# Patient Record
Sex: Male | Born: 1950 | Race: White | Hispanic: No | Marital: Married | Smoking: Never smoker
Health system: Southern US, Community
[De-identification: ages and names within clinical notes are randomized; demographics above are authoritative.]

## PROBLEM LIST (undated history)

## (undated) DIAGNOSIS — L309 Dermatitis, unspecified: Secondary | ICD-10-CM

## (undated) DIAGNOSIS — G709 Myoneural disorder, unspecified: Secondary | ICD-10-CM

## (undated) DIAGNOSIS — M1990 Unspecified osteoarthritis, unspecified site: Secondary | ICD-10-CM

## (undated) DIAGNOSIS — L509 Urticaria, unspecified: Secondary | ICD-10-CM

## (undated) DIAGNOSIS — G589 Mononeuropathy, unspecified: Secondary | ICD-10-CM

## (undated) DIAGNOSIS — H409 Unspecified glaucoma: Secondary | ICD-10-CM

## (undated) DIAGNOSIS — T7840XA Allergy, unspecified, initial encounter: Secondary | ICD-10-CM

## (undated) HISTORY — DX: Allergy, unspecified, initial encounter: T78.40XA

## (undated) HISTORY — PX: COLONOSCOPY: SHX174

## (undated) HISTORY — DX: Myoneural disorder, unspecified: G70.9

## (undated) HISTORY — PX: TONSILLECTOMY: SUR1361

## (undated) HISTORY — DX: Unspecified glaucoma: H40.9

## (undated) HISTORY — DX: Unspecified osteoarthritis, unspecified site: M19.90

---

## 1997-12-01 ENCOUNTER — Ambulatory Visit (HOSPITAL_COMMUNITY): Admission: RE | Admit: 1997-12-01 | Discharge: 1997-12-01 | Payer: Self-pay | Admitting: Internal Medicine

## 1999-09-20 ENCOUNTER — Encounter: Admission: RE | Admit: 1999-09-20 | Discharge: 1999-10-01 | Payer: Self-pay | Admitting: Neurology

## 2001-10-11 ENCOUNTER — Encounter: Payer: Self-pay | Admitting: Internal Medicine

## 2001-10-11 ENCOUNTER — Ambulatory Visit (HOSPITAL_COMMUNITY): Admission: RE | Admit: 2001-10-11 | Discharge: 2001-10-11 | Payer: Self-pay | Admitting: Internal Medicine

## 2011-06-13 ENCOUNTER — Other Ambulatory Visit: Payer: Self-pay | Admitting: Orthopedic Surgery

## 2011-06-13 DIAGNOSIS — M545 Low back pain, unspecified: Secondary | ICD-10-CM

## 2011-06-17 ENCOUNTER — Other Ambulatory Visit: Payer: Self-pay

## 2011-06-21 ENCOUNTER — Ambulatory Visit
Admission: RE | Admit: 2011-06-21 | Discharge: 2011-06-21 | Disposition: A | Discharge status: Home / Self Care | Payer: Managed Care, Other (non HMO) | Source: Ambulatory Visit | Attending: Orthopedic Surgery | Admitting: Orthopedic Surgery

## 2011-06-21 DIAGNOSIS — M545 Low back pain, unspecified: Secondary | ICD-10-CM

## 2011-06-21 MED ORDER — METHYLPREDNISOLONE ACETATE 40 MG/ML INJ SUSP (RADIOLOG
120.0000 mg | Freq: Once | INTRAMUSCULAR | Status: AC
  Administered 2011-06-21: 120 mg via EPIDURAL

## 2011-06-21 MED ORDER — IOHEXOL 180 MG/ML  SOLN
1.0000 mL | Freq: Once | INTRAMUSCULAR | Status: AC | PRN
  Administered 2011-06-21: 1 mL via EPIDURAL

## 2011-06-21 NOTE — Patient Instructions (Signed)

## 2011-06-30 ENCOUNTER — Other Ambulatory Visit: Payer: Self-pay | Admitting: Orthopedic Surgery

## 2011-06-30 DIAGNOSIS — M549 Dorsalgia, unspecified: Secondary | ICD-10-CM

## 2011-07-07 ENCOUNTER — Ambulatory Visit
Admission: RE | Admit: 2011-07-07 | Discharge: 2011-07-07 | Disposition: A | Discharge status: Home / Self Care | Payer: Managed Care, Other (non HMO) | Source: Ambulatory Visit | Attending: Orthopedic Surgery | Admitting: Orthopedic Surgery

## 2011-07-07 DIAGNOSIS — M549 Dorsalgia, unspecified: Secondary | ICD-10-CM

## 2011-07-07 MED ORDER — METHYLPREDNISOLONE ACETATE 40 MG/ML INJ SUSP (RADIOLOG
120.0000 mg | Freq: Once | INTRAMUSCULAR | Status: AC
  Administered 2011-07-07: 120 mg via EPIDURAL

## 2011-07-07 MED ORDER — IOHEXOL 180 MG/ML  SOLN
1.0000 mL | Freq: Once | INTRAMUSCULAR | Status: AC | PRN
  Administered 2011-07-07: 1 mL via EPIDURAL

## 2011-07-07 NOTE — Progress Notes (Signed)
Ambulates alone, gait steady.  Denies numbness or tingling.  Comfortable at present.   Dr Benard Rink here to see patient.  Reviewed discharge instructions & patient states that he understands.  1100  Discharged to home (wife to drive).

## 2012-06-10 DIAGNOSIS — L509 Urticaria, unspecified: Secondary | ICD-10-CM

## 2012-06-10 HISTORY — DX: Urticaria, unspecified: L50.9

## 2012-10-18 ENCOUNTER — Other Ambulatory Visit: Payer: Self-pay | Admitting: Internal Medicine

## 2012-10-18 DIAGNOSIS — E079 Disorder of thyroid, unspecified: Secondary | ICD-10-CM

## 2012-10-18 DIAGNOSIS — Z0000 Encounter for general adult medical examination without abnormal findings: Secondary | ICD-10-CM

## 2012-10-19 ENCOUNTER — Ambulatory Visit
Admission: RE | Admit: 2012-10-19 | Discharge: 2012-10-19 | Disposition: A | Discharge status: Home / Self Care | Payer: Managed Care, Other (non HMO) | Source: Ambulatory Visit | Attending: Internal Medicine | Admitting: Internal Medicine

## 2012-10-19 DIAGNOSIS — E079 Disorder of thyroid, unspecified: Secondary | ICD-10-CM

## 2012-10-22 ENCOUNTER — Other Ambulatory Visit: Payer: Self-pay | Admitting: Internal Medicine

## 2012-10-22 DIAGNOSIS — E041 Nontoxic single thyroid nodule: Secondary | ICD-10-CM

## 2012-10-30 ENCOUNTER — Other Ambulatory Visit (HOSPITAL_COMMUNITY)
Admission: RE | Admit: 2012-10-30 | Discharge: 2012-10-30 | Disposition: A | Discharge status: Home / Self Care | Payer: Managed Care, Other (non HMO) | Source: Ambulatory Visit | Attending: Interventional Radiology | Admitting: Interventional Radiology

## 2012-10-30 ENCOUNTER — Ambulatory Visit
Admission: RE | Admit: 2012-10-30 | Discharge: 2012-10-30 | Disposition: A | Discharge status: Home / Self Care | Payer: Managed Care, Other (non HMO) | Source: Ambulatory Visit | Attending: Internal Medicine | Admitting: Internal Medicine

## 2012-10-30 DIAGNOSIS — E049 Nontoxic goiter, unspecified: Secondary | ICD-10-CM | POA: Insufficient documentation

## 2012-10-30 DIAGNOSIS — E041 Nontoxic single thyroid nodule: Secondary | ICD-10-CM

## 2012-11-08 HISTORY — PX: THYROID CYST EXCISION: SHX2511

## 2012-11-29 ENCOUNTER — Other Ambulatory Visit: Payer: Self-pay | Admitting: Internal Medicine

## 2012-11-29 DIAGNOSIS — M545 Low back pain, unspecified: Secondary | ICD-10-CM

## 2012-12-02 ENCOUNTER — Ambulatory Visit
Admission: RE | Admit: 2012-12-02 | Discharge: 2012-12-02 | Disposition: A | Discharge status: Home / Self Care | Payer: 59 | Source: Ambulatory Visit | Attending: Internal Medicine | Admitting: Internal Medicine

## 2012-12-02 DIAGNOSIS — M545 Low back pain, unspecified: Secondary | ICD-10-CM

## 2012-12-10 ENCOUNTER — Encounter (HOSPITAL_COMMUNITY): Payer: Self-pay

## 2012-12-10 ENCOUNTER — Ambulatory Visit (HOSPITAL_COMMUNITY): Payer: Managed Care, Other (non HMO) | Admitting: Anesthesiology

## 2012-12-10 ENCOUNTER — Other Ambulatory Visit: Payer: Self-pay | Admitting: Neurosurgery

## 2012-12-10 ENCOUNTER — Ambulatory Visit (HOSPITAL_COMMUNITY): Payer: Managed Care, Other (non HMO)

## 2012-12-10 ENCOUNTER — Encounter (HOSPITAL_COMMUNITY)
Admission: RE | Disposition: A | Discharge status: Home / Self Care | Payer: Self-pay | Source: Ambulatory Visit | Attending: Neurosurgery

## 2012-12-10 ENCOUNTER — Encounter (HOSPITAL_COMMUNITY): Payer: Self-pay | Admitting: Anesthesiology

## 2012-12-10 ENCOUNTER — Observation Stay (HOSPITAL_COMMUNITY)
Admission: RE | Admit: 2012-12-10 | Discharge: 2012-12-11 | Disposition: A | Discharge status: Home / Self Care | Payer: Managed Care, Other (non HMO) | Source: Ambulatory Visit | Attending: Neurosurgery | Admitting: Neurosurgery

## 2012-12-10 DIAGNOSIS — Z79899 Other long term (current) drug therapy: Secondary | ICD-10-CM | POA: Insufficient documentation

## 2012-12-10 DIAGNOSIS — M48061 Spinal stenosis, lumbar region without neurogenic claudication: Secondary | ICD-10-CM | POA: Insufficient documentation

## 2012-12-10 DIAGNOSIS — M7130 Other bursal cyst, unspecified site: Principal | ICD-10-CM | POA: Insufficient documentation

## 2012-12-10 HISTORY — DX: Dermatitis, unspecified: L30.9

## 2012-12-10 HISTORY — PX: LUMBAR LAMINECTOMY/DECOMPRESSION MICRODISCECTOMY: SHX5026

## 2012-12-10 HISTORY — DX: Urticaria, unspecified: L50.9

## 2012-12-10 HISTORY — DX: Mononeuropathy, unspecified: G58.9

## 2012-12-10 LAB — CBC
HCT: 42.1 % (ref 39.0–52.0)
MCHC: 33.7 g/dL (ref 30.0–36.0)
MCV: 87.3 fL (ref 78.0–100.0)
RDW: 13.4 % (ref 11.5–15.5)
WBC: 6.3 10*3/uL (ref 4.0–10.5)

## 2012-12-10 SURGERY — LUMBAR LAMINECTOMY/DECOMPRESSION MICRODISCECTOMY 1 LEVEL
Anesthesia: General | Site: Spine Lumbar | Wound class: Clean

## 2012-12-10 MED ORDER — LIDOCAINE-EPINEPHRINE 1 %-1:100000 IJ SOLN
INTRAMUSCULAR | Status: DC | PRN
  Administered 2012-12-10: 10 mL

## 2012-12-10 MED ORDER — EPHEDRINE SULFATE 50 MG/ML IJ SOLN
INTRAMUSCULAR | Status: DC | PRN
  Administered 2012-12-10: 10 mg via INTRAVENOUS

## 2012-12-10 MED ORDER — BACITRACIN 50000 UNITS IM SOLR
INTRAMUSCULAR | Status: AC
  Filled 2012-12-10: qty 1

## 2012-12-10 MED ORDER — OXYCODONE-ACETAMINOPHEN 5-325 MG PO TABS: 1.0000 | ORAL_TABLET | ORAL | Status: DC | PRN

## 2012-12-10 MED ORDER — ONDANSETRON HCL 4 MG/2ML IJ SOLN: 4.0000 mg | INTRAMUSCULAR | Status: DC | PRN

## 2012-12-10 MED ORDER — ACETAMINOPHEN 325 MG PO TABS: 650.0000 mg | ORAL_TABLET | ORAL | Status: DC | PRN

## 2012-12-10 MED ORDER — THROMBIN 5000 UNITS EX SOLR
OROMUCOSAL | Status: DC | PRN
  Administered 2012-12-10: 20:00:00 via TOPICAL

## 2012-12-10 MED ORDER — OXYCODONE HCL 5 MG PO TABS: 5.0000 mg | ORAL_TABLET | Freq: Once | ORAL | Status: DC | PRN

## 2012-12-10 MED ORDER — SODIUM CHLORIDE 0.9 % IV SOLN: 250.0000 mL | INTRAVENOUS | Status: DC

## 2012-12-10 MED ORDER — ROCURONIUM BROMIDE 100 MG/10ML IV SOLN
INTRAVENOUS | Status: DC | PRN
  Administered 2012-12-10: 40 mg via INTRAVENOUS
  Administered 2012-12-10: 10 mg via INTRAVENOUS

## 2012-12-10 MED ORDER — ONDANSETRON HCL 4 MG/2ML IJ SOLN
INTRAMUSCULAR | Status: DC | PRN
  Administered 2012-12-10: 4 mg via INTRAVENOUS

## 2012-12-10 MED ORDER — PREDNISONE 10 MG PO TABS: 10.0000 mg | ORAL_TABLET | Freq: Every day | ORAL | Status: DC

## 2012-12-10 MED ORDER — HEMOSTATIC AGENTS (NO CHARGE) OPTIME
TOPICAL | Status: DC | PRN
  Administered 2012-12-10 (×2): 1 via TOPICAL

## 2012-12-10 MED ORDER — PROPOFOL 10 MG/ML IV BOLUS
INTRAVENOUS | Status: DC | PRN
  Administered 2012-12-10: 170 mg via INTRAVENOUS

## 2012-12-10 MED ORDER — MUPIROCIN 2 % EX OINT: TOPICAL_OINTMENT | Freq: Two times a day (BID) | CUTANEOUS | Status: DC

## 2012-12-10 MED ORDER — ADULT MULTIVITAMIN W/MINERALS CH
1.0000 | ORAL_TABLET | Freq: Every day | ORAL | Status: DC
  Filled 2012-12-10: qty 1

## 2012-12-10 MED ORDER — SODIUM CHLORIDE 0.9 % IJ SOLN
3.0000 mL | Freq: Two times a day (BID) | INTRAMUSCULAR | Status: DC
  Administered 2012-12-10: 3 mL via INTRAVENOUS

## 2012-12-10 MED ORDER — LIDOCAINE HCL (CARDIAC) 20 MG/ML IV SOLN
INTRAVENOUS | Status: DC | PRN
  Administered 2012-12-10: 60 mg via INTRAVENOUS

## 2012-12-10 MED ORDER — CYCLOBENZAPRINE HCL 10 MG PO TABS: 10.0000 mg | ORAL_TABLET | Freq: Three times a day (TID) | ORAL | Status: DC | PRN

## 2012-12-10 MED ORDER — CEFAZOLIN SODIUM 1-5 GM-% IV SOLN
1.0000 g | Freq: Three times a day (TID) | INTRAVENOUS | Status: DC
  Administered 2012-12-11: 1 g via INTRAVENOUS
  Filled 2012-12-10 (×2): qty 50

## 2012-12-10 MED ORDER — SODIUM CHLORIDE 0.9 % IR SOLN
Status: DC | PRN
  Administered 2012-12-10: 19:00:00

## 2012-12-10 MED ORDER — LACTATED RINGERS IV SOLN
INTRAVENOUS | Status: DC | PRN
  Administered 2012-12-10 (×4): via INTRAVENOUS

## 2012-12-10 MED ORDER — HYDROMORPHONE HCL PF 1 MG/ML IJ SOLN: 0.5000 mg | INTRAMUSCULAR | Status: DC | PRN

## 2012-12-10 MED ORDER — DOCUSATE SODIUM 100 MG PO CAPS
100.0000 mg | ORAL_CAPSULE | Freq: Two times a day (BID) | ORAL | Status: DC
  Administered 2012-12-10: 100 mg via ORAL

## 2012-12-10 MED ORDER — OXYCODONE HCL 5 MG/5ML PO SOLN: 5.0000 mg | Freq: Once | ORAL | Status: DC | PRN

## 2012-12-10 MED ORDER — ACETAMINOPHEN 650 MG RE SUPP: 650.0000 mg | RECTAL | Status: DC | PRN

## 2012-12-10 MED ORDER — PHENOL 1.4 % MT LIQD: 1.0000 | OROMUCOSAL | Status: DC | PRN

## 2012-12-10 MED ORDER — NEOSTIGMINE METHYLSULFATE 1 MG/ML IJ SOLN
INTRAMUSCULAR | Status: DC | PRN
  Administered 2012-12-10: 5 mg via INTRAVENOUS

## 2012-12-10 MED ORDER — HYDROMORPHONE HCL PF 1 MG/ML IJ SOLN: 0.2500 mg | INTRAMUSCULAR | Status: DC | PRN

## 2012-12-10 MED ORDER — GLYCOPYRROLATE 0.2 MG/ML IJ SOLN
INTRAMUSCULAR | Status: DC | PRN
  Administered 2012-12-10: 0.6 mg via INTRAVENOUS

## 2012-12-10 MED ORDER — PROMETHAZINE HCL 25 MG/ML IJ SOLN: 6.2500 mg | INTRAMUSCULAR | Status: DC | PRN

## 2012-12-10 MED ORDER — MIDAZOLAM HCL 5 MG/5ML IJ SOLN
INTRAMUSCULAR | Status: DC | PRN
  Administered 2012-12-10: 2 mg via INTRAVENOUS

## 2012-12-10 MED ORDER — DEXAMETHASONE SODIUM PHOSPHATE 4 MG/ML IJ SOLN
INTRAMUSCULAR | Status: DC | PRN
  Administered 2012-12-10: 10 mg via INTRAVENOUS

## 2012-12-10 MED ORDER — 0.9 % SODIUM CHLORIDE (POUR BTL) OPTIME
TOPICAL | Status: DC | PRN
  Administered 2012-12-10: 1000 mL

## 2012-12-10 MED ORDER — CEFAZOLIN SODIUM-DEXTROSE 2-3 GM-% IV SOLR
INTRAVENOUS | Status: AC
  Administered 2012-12-10: 2 g via INTRAVENOUS
  Filled 2012-12-10: qty 50

## 2012-12-10 MED ORDER — THROMBIN 5000 UNITS EX SOLR
CUTANEOUS | Status: DC | PRN
  Administered 2012-12-10 (×2): 5000 [IU] via TOPICAL

## 2012-12-10 MED ORDER — FENTANYL CITRATE 0.05 MG/ML IJ SOLN
INTRAMUSCULAR | Status: DC | PRN
  Administered 2012-12-10: 50 ug via INTRAVENOUS
  Administered 2012-12-10 (×2): 100 ug via INTRAVENOUS

## 2012-12-10 MED ORDER — SODIUM CHLORIDE 0.9 % IV SOLN
INTRAVENOUS | Status: AC
  Filled 2012-12-10: qty 500

## 2012-12-10 MED ORDER — DICLOFENAC SODIUM 75 MG PO TBEC
75.0000 mg | DELAYED_RELEASE_TABLET | Freq: Two times a day (BID) | ORAL | Status: DC | PRN
  Filled 2012-12-10: qty 1

## 2012-12-10 MED ORDER — ACETAMINOPHEN 10 MG/ML IV SOLN
1000.0000 mg | Freq: Four times a day (QID) | INTRAVENOUS | Status: DC
  Administered 2012-12-10 – 2012-12-11 (×2): 1000 mg via INTRAVENOUS
  Filled 2012-12-10 (×4): qty 100

## 2012-12-10 MED ORDER — MUPIROCIN 2 % EX OINT
TOPICAL_OINTMENT | CUTANEOUS | Status: AC
  Administered 2012-12-10: 1 via NASAL
  Filled 2012-12-10: qty 22

## 2012-12-10 MED ORDER — ALUM & MAG HYDROXIDE-SIMETH 200-200-20 MG/5ML PO SUSP: 30.0000 mL | Freq: Four times a day (QID) | ORAL | Status: DC | PRN

## 2012-12-10 MED ORDER — MENTHOL 3 MG MT LOZG: 1.0000 | LOZENGE | OROMUCOSAL | Status: DC | PRN

## 2012-12-10 MED ORDER — BUPIVACAINE HCL (PF) 0.25 % IJ SOLN
INTRAMUSCULAR | Status: DC | PRN
  Administered 2012-12-10: 10 mL

## 2012-12-10 MED ORDER — SODIUM CHLORIDE 0.9 % IJ SOLN: 3.0000 mL | INTRAMUSCULAR | Status: DC | PRN

## 2012-12-10 MED ORDER — ARTIFICIAL TEARS OP OINT
TOPICAL_OINTMENT | OPHTHALMIC | Status: DC | PRN
  Administered 2012-12-10: 1 via OPHTHALMIC

## 2012-12-10 SURGICAL SUPPLY — 63 items
ADH SKN CLS APL DERMABOND .7 (GAUZE/BANDAGES/DRESSINGS)
APL SKNCLS STERI-STRIP NONHPOA (GAUZE/BANDAGES/DRESSINGS) ×1
BAG DECANTER FOR FLEXI CONT (MISCELLANEOUS) ×2 IMPLANT
BENZOIN TINCTURE PRP APPL 2/3 (GAUZE/BANDAGES/DRESSINGS) ×2 IMPLANT
BLADE SURG 11 STRL SS (BLADE) ×2 IMPLANT
BLADE SURG ROTATE 9660 (MISCELLANEOUS) ×1 IMPLANT
BRUSH SCRUB EZ PLAIN DRY (MISCELLANEOUS) ×2 IMPLANT
BUR MATCHSTICK NEURO 3.0 LAGG (BURR) ×2 IMPLANT
BUR PRECISION FLUTE 6.0 (BURR) ×2 IMPLANT
CANISTER SUCTION 2500CC (MISCELLANEOUS) ×2 IMPLANT
CLOTH BEACON ORANGE TIMEOUT ST (SAFETY) ×2 IMPLANT
CONT SPEC 4OZ CLIKSEAL STRL BL (MISCELLANEOUS) ×3 IMPLANT
DECANTER SPIKE VIAL GLASS SM (MISCELLANEOUS) ×2 IMPLANT
DERMABOND ADVANCED (GAUZE/BANDAGES/DRESSINGS)
DERMABOND ADVANCED .7 DNX12 (GAUZE/BANDAGES/DRESSINGS) ×1 IMPLANT
DRAPE LAPAROTOMY 100X72X124 (DRAPES) ×2 IMPLANT
DRAPE MICROSCOPE ZEISS OPMI (DRAPES) ×1 IMPLANT
DRAPE POUCH INSTRU U-SHP 10X18 (DRAPES) ×2 IMPLANT
DRAPE PROXIMA HALF (DRAPES) IMPLANT
DRAPE SURG 17X23 STRL (DRAPES) ×2 IMPLANT
DRSG OPSITE 4X5.5 SM (GAUZE/BANDAGES/DRESSINGS) ×2 IMPLANT
DRSG OPSITE POSTOP 4X6 (GAUZE/BANDAGES/DRESSINGS) ×1 IMPLANT
DURAPREP 26ML APPLICATOR (WOUND CARE) ×2 IMPLANT
ELECT REM PT RETURN 9FT ADLT (ELECTROSURGICAL) ×2
ELECTRODE REM PT RTRN 9FT ADLT (ELECTROSURGICAL) ×1 IMPLANT
GAUZE SPONGE 4X4 16PLY XRAY LF (GAUZE/BANDAGES/DRESSINGS) IMPLANT
GLOVE BIO SURGEON STRL SZ 6.5 (GLOVE) ×1 IMPLANT
GLOVE BIO SURGEON STRL SZ7 (GLOVE) ×1 IMPLANT
GLOVE BIO SURGEON STRL SZ8 (GLOVE) ×2 IMPLANT
GLOVE BIOGEL M 8.0 STRL (GLOVE) ×1 IMPLANT
GLOVE BIOGEL PI IND STRL 7.5 (GLOVE) IMPLANT
GLOVE BIOGEL PI INDICATOR 7.5 (GLOVE) ×1
GLOVE ECLIPSE 7.5 STRL STRAW (GLOVE) ×2 IMPLANT
GLOVE EXAM NITRILE LRG STRL (GLOVE) IMPLANT
GLOVE EXAM NITRILE MD LF STRL (GLOVE) ×1 IMPLANT
GLOVE EXAM NITRILE XL STR (GLOVE) IMPLANT
GLOVE EXAM NITRILE XS STR PU (GLOVE) IMPLANT
GLOVE INDICATOR 8.5 STRL (GLOVE) ×2 IMPLANT
GOWN BRE IMP SLV AUR LG STRL (GOWN DISPOSABLE) ×3 IMPLANT
GOWN BRE IMP SLV AUR XL STRL (GOWN DISPOSABLE) ×4 IMPLANT
GOWN STRL REIN 2XL LVL4 (GOWN DISPOSABLE) IMPLANT
KIT BASIN OR (CUSTOM PROCEDURE TRAY) ×2 IMPLANT
KIT ROOM TURNOVER OR (KITS) ×2 IMPLANT
NDL SPNL 22GX3.5 QUINCKE BK (NEEDLE) ×1 IMPLANT
NEEDLE HYPO 22GX1.5 SAFETY (NEEDLE) ×2 IMPLANT
NEEDLE SPNL 22GX3.5 QUINCKE BK (NEEDLE) ×2 IMPLANT
NS IRRIG 1000ML POUR BTL (IV SOLUTION) ×2 IMPLANT
PACK LAMINECTOMY NEURO (CUSTOM PROCEDURE TRAY) ×2 IMPLANT
RUBBERBAND STERILE (MISCELLANEOUS) ×4 IMPLANT
SPECIMEN JAR SMALL (MISCELLANEOUS) ×1 IMPLANT
SPONGE GAUZE 4X4 12PLY (GAUZE/BANDAGES/DRESSINGS) ×2 IMPLANT
SPONGE SURGIFOAM ABS GEL SZ50 (HEMOSTASIS) ×3 IMPLANT
STRIP CLOSURE SKIN 1/2X4 (GAUZE/BANDAGES/DRESSINGS) ×2 IMPLANT
SUT SILK 3 0 SH 30 (SUTURE) ×1 IMPLANT
SUT VIC AB 0 CT1 18XCR BRD8 (SUTURE) ×1 IMPLANT
SUT VIC AB 0 CT1 8-18 (SUTURE) ×2
SUT VIC AB 2-0 CT1 18 (SUTURE) ×2 IMPLANT
SUT VICRYL 4-0 PS2 18IN ABS (SUTURE) ×2 IMPLANT
SYR 20ML ECCENTRIC (SYRINGE) ×2 IMPLANT
TAPE STRIPS DRAPE STRL (GAUZE/BANDAGES/DRESSINGS) ×1 IMPLANT
TOWEL OR 17X24 6PK STRL BLUE (TOWEL DISPOSABLE) ×2 IMPLANT
TOWEL OR 17X26 10 PK STRL BLUE (TOWEL DISPOSABLE) ×2 IMPLANT
WATER STERILE IRR 1000ML POUR (IV SOLUTION) ×2 IMPLANT

## 2012-12-10 NOTE — H&P (Addendum)
Glen Parker is an 62 y.o. male.   Chief Complaint: Left hip leg pain numbness and weakness HPI: Patient is a 62 year old woman who has had really on of back and leg pain has progressed  to weakness. This began in the beginning of May report first episode was awakening him Mother's Day felt severe pain is leg getting out of the shower. Initially this got better however he  aggravated over a week ago when he was leaning is concrete wall leaning against his lower back causing pressure and created numbness in his left leg. He also noted some numbness lack of sensation in his scrotum and penis he also noted progressive weakness.   Workup was initiated MRI was taken and he was referred to me. He currently complains of the pain rating probably the left hip down the outside and back of his left thigh and calf and is on his foot he denies any right leg symptoms denies any trouble does bowel or bladder denies any real back  pain. His MRI scan showed a very large synovial cyst that began off the left L4-5 facet joint and were crossing midline to ask about 90% of his canal. Causing severe thecal sac compression. Due to his progressive weakness of his left lower extremity was sized synovial cyst I have recommended decompressive laminectomy for resection of synovial cyst. I have extensively reviewed the risks and benefits of the operation as well as perioperative course expectations of outcome and alternatives of surgery and he understands and agrees to proceed forward.  Past Medical History  Diagnosis Date  . 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 Eczema     occasional  . Pinched nerve     hx of in neck. 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    Past Surgical History  Procedure Laterality Date  . Tonsillectomy      as child  . Thyroid cyst excision  11/2012    right    Family History  Problem Relation Age of Onset  . Hypertension Mother   . Heart disease Father   . Hypertension Sister    . Bone cancer Other    Social History:  reports that he has never smoked. He does not have any smokeless tobacco history on file. He reports that he drinks about 0.6 ounces of alcohol per week. His drug history is not on file.  Allergies: No Known Allergies  Medications Prior to Admission  Medication Sig Dispense Refill  . Ascorbic Acid (VITAMIN C PO) Take 0.5 tablets by mouth daily.      . brimonidine-timolol (COMBIGAN) 0.2-0.5 % ophthalmic solution Place 1 drop into both eyes every 12 (twelve) hours.      . diclofenac (VOLTAREN) 75 MG EC tablet Take 75 mg by mouth 2 (two) times daily as needed (for pain).      . magnesium oxide (MAG-OX) 400 MG tablet Take 400 mg by mouth daily.      . Multiple Vitamin (MULTIVITAMIN WITH MINERALS) TABS Take 1 tablet by mouth daily.      . Omega 3-6-9 Fatty Acids (OMEGA 3-6-9 COMPLEX) CAPS Take 1 capsule by mouth at bedtime.      Marland Kitchen OVER THE COUNTER MEDICATION Take 0.5 tablets by mouth daily. Antihistamine from [facility] - All Clear      . PRESCRIPTION MEDICATION Apply 1 application topically daily as needed (for eczema around eyes). 4 different creams - 1 sample from doctor's office - others not  from [facility] - alternates the 4 creams - only uses one type each day      . predniSONE (DELTASONE) 10 MG tablet Take 10 mg by mouth daily. 6 day dose pak completed on 12/03/12        Results for orders placed during the hospital encounter of 12/10/12 (from the past 48 hour(s))  SURGICAL PCR SCREEN     Status: None   Collection Time    12/10/12  3:25 PM      Result Value Range   MRSA, PCR NEGATIVE  NEGATIVE   Staphylococcus aureus NEGATIVE  NEGATIVE   Comment:            The Xpert SA Assay (FDA     approved for NASAL specimens     in patients over 64 years of age),     is one component of     a comprehensive surveillance     program.  Test performance has     been validated by [employer] for patients greater     than or equal to 11 year old.     It is not  intended     to diagnose infection nor to     guide or monitor treatment.  CBC     Status: None   Collection Time    12/10/12  3:26 PM      Result Value Range   WBC 6.3  4.0 - 10.5 K/uL   RBC 4.82  4.22 - 5.81 MIL/uL   Hemoglobin 14.2  13.0 - 17.0 g/dL   HCT 16.1  09.6 - 04.5 %   MCV 87.3  78.0 - 100.0 fL   MCH 29.5  26.0 - 34.0 pg   MCHC 33.7  30.0 - 36.0 g/dL   RDW 40.9  81.1 - 91.4 %   Platelets 193  150 - 400 K/uL   No results found.  Review of Systems  Constitutional: Negative.   HENT: Negative.   Eyes: Negative.   Respiratory: Negative.   Cardiovascular: Negative.   Gastrointestinal: Negative.   Genitourinary: Negative.   Musculoskeletal: Positive for myalgias, joint pain and falls.  Skin: Negative.   Neurological: Positive for tingling, tremors and sensory change.  Endo/Heme/Allergies: Negative.   Psychiatric/Behavioral: Negative.     Blood pressure 128/93, pulse 66, temperature 98.4 F (36.9 C), temperature source Oral, resp. rate 16, height 5' 10.5" (1.791 m), weight 76.006 kg (167 lb 9 oz), SpO2 97.00%. Physical Exam  Constitutional: He is oriented to person, place, and time. He appears well-developed and well-nourished.  HENT:  Head: Normocephalic.  Eyes: Pupils are equal, round, and reactive to light.  Neck: Normal range of motion.  Cardiovascular: Normal rate.   Respiratory: Effort normal.  GI: Soft.  Musculoskeletal: Normal range of motion.  Neurological: He is alert and oriented to person, place, and time. He displays atrophy. GCS eye subscore is 4. GCS verbal subscore is 5. GCS motor subscore is 6.  Reflex Scores:      Tricep reflexes are 2+ on the right side and 2+ on the left side.      Bicep reflexes are 2+ on the right side and 2+ on the left side.      Brachioradialis reflexes are 2+ on the right side and 2+ on the left side.      Patellar reflexes are 2+ on the right side and 2+ on the left side.      Achilles reflexes are 0  on the right side  and 0 on the left side. Strength in the right lower extremity is 5 out of 5 in his iliopsoas, quads commands-tibialis, EHL. Strength in the left lower extremity is 4+ out of 5 in his quads and hamstrings 4+ out of 5 dorsiflexor and EHL 5 out of 5 plantar flexion     Assessment/Plan 62 year old presents for L4-5 decompressive laminectomy for removal of a synovial cyst.  Fraidy Mccarrick P 12/10/2012, 5:38 PM

## 2012-12-10 NOTE — Anesthesia Procedure Notes (Signed)
Procedure Name: Intubation Date/Time: 12/10/2012 6:28 PM Performed by: Brien Mates DOBSON Pre-anesthesia Checklist: Patient identified, Emergency Drugs available, Suction available, Patient being monitored and Timeout performed Patient Re-evaluated:Patient Re-evaluated prior to inductionOxygen Delivery Method: Circle system utilized Preoxygenation: Pre-oxygenation with 100% oxygen Intubation Type: IV induction Ventilation: Mask ventilation without difficulty Laryngoscope Size: Miller and 2 Grade View: Grade I Tube type: Oral Tube size: 7.5 mm Number of attempts: 1 Airway Equipment and Method: Stylet Placement Confirmation: ETT inserted through vocal cords under direct vision,  positive ETCO2 and breath sounds checked- equal and bilateral Secured at: 23 cm Tube secured with: Tape Dental Injury: Teeth and Oropharynx as per pre-operative assessment

## 2012-12-10 NOTE — Anesthesia Preprocedure Evaluation (Addendum)
Anesthesia Evaluation    Reviewed: Allergy & Precautions, H&P , NPO status , Patient's Chart, lab work & pertinent test results  History of Anesthesia Complications Negative for: history of anesthetic complications  Airway Mallampati: I TM Distance: >3 FB Neck ROM: Full    Dental  (+) Teeth Intact and Dental Advisory Given   Pulmonary neg pulmonary ROS,  breath sounds clear to auscultation        Cardiovascular negative cardio ROS  Rhythm:Regular Rate:Normal     Neuro/Psych negative psych ROS   GI/Hepatic negative GI ROS, Neg liver ROS,   Endo/Other  negative endocrine ROS  Renal/GU negative Renal ROS     Musculoskeletal   Abdominal Normal abdominal exam  (+)   Peds  Hematology   Anesthesia Other Findings   Reproductive/Obstetrics                          Anesthesia Physical Anesthesia Plan  ASA: II  Anesthesia Plan: General   Post-op Pain Management:    Induction: Intravenous  Airway Management Planned: Oral ETT  Additional Equipment:   Intra-op Plan:   Post-operative Plan: Extubation in OR  Informed Consent:   Plan Discussed with: CRNA, Anesthesiologist and Surgeon  Anesthesia Plan Comments:         Anesthesia Quick Evaluation

## 2012-12-10 NOTE — Transfer of Care (Signed)
Immediate Anesthesia Transfer of Care Note  Patient: Glen Parker  Procedure(s) Performed: Procedure(s) with comments: LUMBAR LAMINECTOMY/DECOMPRESSION MICRODISCECTOMY 1 LEVEL (N/A) - Lumbar four-five  Patient Location: PACU  Anesthesia Type:General  Level of Consciousness: sedated  Airway & Oxygen Therapy: Patient Spontanous Breathing and Patient connected to face mask oxygen  Post-op Assessment: Report given to PACU RN and Post -op Vital signs reviewed and stable  Post vital signs: Reviewed and stable  Complications: No apparent anesthesia complications

## 2012-12-10 NOTE — Preoperative (Signed)
Beta Blockers   Reason not to administer Beta Blockers:Not Applicable 

## 2012-12-10 NOTE — Op Note (Signed)
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  EBL: Minimal  History of present illness: Patient is a very pleasant 62 year old gentleman who over the last week his head weakness in his left leg and the last 2 weeks have progressive worsening pain and now a numbness and weakness in the left leg workup revealed a very large synovial cyst causing severe thecal sac and cauda equina compression patient had weakness an exam and episodic numbness in his perineum so patient was recommended urgent resection of a large L4-5 synovial cyst I extensively reviewed the risks and benefits of the operation as well as perioperative course and expectations of outcome and alternatives to surgery he understood and agreed to proceed forward.  Operative procedure: Patient was induced under general anesthesia was positioned prone the Wilson frame his back was prepped and draped in routine sterile fashion. Preoperative x-ray localize the appropriate level so after infiltration 10 cc lidocaine with epi a midline incision was made and Bovie light car was used to gas subcutaneous tissues and subperiosteal dissections care on entire lamina of L4 the super aspect of the lamina of L5. Interoperative X. identify the appropriate level the spinous process of L4 smooth central decompression was begun the laminotomy was extended slightly inferiorly to include just the superior aspect of the L5 lamina and the entire L4 lamina was removed this is was immediately identified and the dorsal spinal canal native dura was identified right of midline right of  the cyst and inferiorly and then working from normal dura through the cyst the cyst wall was carefully dissected off of the dura under microscopic illumination cyst was then fenestrated and a large amount of old hematoma was aspirated the aspiration of the hematoma immediately decompress the thecal sac and then care was taken to resect the membrane off the dura and the entire medial membrane of the symphysis was resected off the dura. I extended down inferiorly to identify the L4-5 disc space and the L5 nerve and the L5 pedicle awl cyst was resected off the medial aspect of super aspect of the L5 pedicle extending from the disc space all the way up to the L4 pedicle and the L4 nerve was also identified there was extensive amount of cyst wall material encasing the L4 nerve this was dissected off of the L4 nerve root the nerve hook and resected. I extended the laminotomy and C2 just above the shoulder of the L4 nerve root and then cleaned out all the cyst wall at appreciate from the lateral aspect of the canal and the medial aspect of the facet complex from just inferior to the L4 nerve root all the way to the top of the L5 nerve root. At the end of the resection I saw no further fragments of cyst wall to sac was widely decompressed the L4 and L4 and 5 nerve roots were clearly visualized and decompressed the foramen easily excepting a hockey-stick and coronary dilator. Epidural veins are coagulated Dickinson a stasis was maintained Gelfoam was laid up the dura a medium neck drain was placed and the wounds closed in layers with after Vicryl and a running 4 subcuticular in the skin.  At the end of case all needle counts and sponge counts were correct.

## 2012-12-10 NOTE — Anesthesia Postprocedure Evaluation (Signed)
  Anesthesia Post-op Note  Patient: Glen Parker  Procedure(s) Performed: Procedure(s) with comments: LUMBAR LAMINECTOMY/DECOMPRESSION MICRODISCECTOMY 1 LEVEL (N/A) - Lumbar four-five  Patient Location: PACU  Anesthesia Type:General  Level of Consciousness: awake  Airway and Oxygen Therapy: Patient Spontanous Breathing  Post-op Pain: mild  Post-op Assessment: Post-op Vital signs reviewed  Post-op Vital Signs: Reviewed  Complications: No apparent anesthesia complications

## 2012-12-11 ENCOUNTER — Encounter (HOSPITAL_COMMUNITY): Payer: Self-pay | Admitting: Neurosurgery

## 2012-12-11 MED ORDER — CYCLOBENZAPRINE HCL 10 MG PO TABS: 10.0000 mg | ORAL_TABLET | Freq: Three times a day (TID) | ORAL | Status: DC | PRN

## 2012-12-11 MED ORDER — OXYCODONE-ACETAMINOPHEN 5-325 MG PO TABS: 1.0000 | ORAL_TABLET | ORAL | Status: DC | PRN

## 2012-12-11 NOTE — Progress Notes (Signed)
Patient ID: Glen Parker, male   DOB: Dec 09, 1950, 62 y.o.   MRN: [id number] Patient doing very well leg pain gone numbness improved strength improved minimal back pain wound clean and dry

## 2012-12-11 NOTE — Progress Notes (Signed)
Pt and wife given D/C instructions with Rx's, verbal understanding given. Pt D/C'd home via wheelchair @ 1050 per MD order. Zevin Nevares, RN  

## 2012-12-11 NOTE — Discharge Summary (Signed)
  Physician Discharge Summary  Patient ID: Glen Parker MRN: [id number] DOB/AGE: 03/16/1951 62 y.o.  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 *   Discharged Condition: good  Hospital Course: Patient hospital underwent laminectomy and resection of synovial cyst postop patient did very well went to the floor was angling and voiding spontaneously tolerating rigid diet was stable for discharge  Consults: Significant Diagnostic Studies: Treatments: Decompress limits at L4-5 for resection of synovial cyst Discharge Exam: Blood pressure 134/86, pulse 76, temperature 98.6 F (37 C), temperature source Oral, resp. rate 16, height 5' 10.5" (1.791 m), weight 76.006 kg (167 lb 9 oz), SpO2 96.00%. Thank out of 5 wound clean and dry home  Disposition: Home     Medication List    TAKE these medications       COMBIGAN 0.2-0.5 % ophthalmic solution  Generic drug:  brimonidine-timolol  Place 1 drop into both eyes every 12 (twelve) hours.     cyclobenzaprine 10 MG tablet  Commonly known as:  FLEXERIL  Take 1 tablet (10 mg total) by mouth 3 (three) times daily as needed for muscle spasms.     diclofenac 75 MG EC tablet  Commonly known as:  VOLTAREN  Take 75 mg by mouth 2 (two) times daily as needed (for pain).     magnesium oxide 400 MG tablet  Commonly known as:  MAG-OX  Take 400 mg by mouth daily.     multivitamin with minerals Tabs  Take 1 tablet by mouth daily.     OMEGA 3-6-9 COMPLEX Caps  Take 1 capsule by mouth at bedtime.     OVER THE COUNTER MEDICATION  Take 0.5 tablets by mouth daily. Antihistamine from [employer] - All Clear     oxyCODONE-acetaminophen 5-325 MG per tablet  Commonly known as:  PERCOCET/ROXICET  Take 1-2 tablets by mouth every 4 (four) hours as needed.     predniSONE 10 MG tablet  Commonly known  as:  DELTASONE  Take 10 mg by mouth daily. 6 day dose pak completed on 12/03/12     PRESCRIPTION MEDICATION  Apply 1 application topically daily as needed (for eczema around eyes). 4 different creams - 1 sample from doctor's office - others not from [employer] - alternates the 4 creams - only uses one type each day     VITAMIN C PO  Take 0.5 tablets by mouth daily.           Follow-up Information   Follow up with Keliyah Spillman P, MD.   Contact information:   [address] [place] [address] [phone]       Signed: Deborah Dondero P 12/11/2012, 10:00 AM

## 2015-10-26 DIAGNOSIS — H1033 Unspecified acute conjunctivitis, bilateral: Secondary | ICD-10-CM | POA: Diagnosis not present

## 2015-10-30 DIAGNOSIS — Z0001 Encounter for general adult medical examination with abnormal findings: Secondary | ICD-10-CM | POA: Diagnosis not present

## 2015-10-30 DIAGNOSIS — E079 Disorder of thyroid, unspecified: Secondary | ICD-10-CM | POA: Diagnosis not present

## 2015-10-30 DIAGNOSIS — R972 Elevated prostate specific antigen [PSA]: Secondary | ICD-10-CM | POA: Diagnosis not present

## 2015-10-30 DIAGNOSIS — E7800 Pure hypercholesterolemia, unspecified: Secondary | ICD-10-CM | POA: Diagnosis not present

## 2015-11-04 DIAGNOSIS — R972 Elevated prostate specific antigen [PSA]: Secondary | ICD-10-CM | POA: Diagnosis not present

## 2015-11-04 DIAGNOSIS — Z0000 Encounter for general adult medical examination without abnormal findings: Secondary | ICD-10-CM | POA: Diagnosis not present

## 2016-01-27 DIAGNOSIS — R69 Illness, unspecified: Secondary | ICD-10-CM | POA: Diagnosis not present

## 2016-02-08 DIAGNOSIS — R972 Elevated prostate specific antigen [PSA]: Secondary | ICD-10-CM | POA: Diagnosis not present

## 2016-02-08 DIAGNOSIS — N402 Nodular prostate without lower urinary tract symptoms: Secondary | ICD-10-CM | POA: Diagnosis not present

## 2016-04-01 DIAGNOSIS — H524 Presbyopia: Secondary | ICD-10-CM | POA: Diagnosis not present

## 2016-04-04 ENCOUNTER — Encounter: Payer: Self-pay | Admitting: Gastroenterology

## 2016-04-15 DIAGNOSIS — Z23 Encounter for immunization: Secondary | ICD-10-CM | POA: Diagnosis not present

## 2016-05-12 ENCOUNTER — Ambulatory Visit (AMBULATORY_SURGERY_CENTER): Payer: Self-pay | Admitting: *Deleted

## 2016-05-12 VITALS — Ht 70.5 in | Wt 179.0 lb

## 2016-05-12 DIAGNOSIS — Z1211 Encounter for screening for malignant neoplasm of colon: Secondary | ICD-10-CM

## 2016-05-12 MED ORDER — NA SULFATE-K SULFATE-MG SULF 17.5-3.13-1.6 GM/177ML PO SOLN: 1.0000 | Freq: Once | ORAL | 0 refills | Status: AC

## 2016-05-12 NOTE — Progress Notes (Signed)
No egg or soy allergy known to patient  No issues with past sedation with any surgeries  or procedures, no intubation problems  No diet pills per patient No home 02 use per patient  No blood thinners per patient  Pt denies issues with constipation  No A fib or A flutter   

## 2016-05-17 ENCOUNTER — Encounter: Payer: Self-pay | Admitting: Gastroenterology

## 2016-05-26 ENCOUNTER — Encounter: Payer: Self-pay | Admitting: Gastroenterology

## 2016-05-26 ENCOUNTER — Ambulatory Visit (AMBULATORY_SURGERY_CENTER): Payer: Medicare HMO | Admitting: Gastroenterology

## 2016-05-26 VITALS — BP 121/77 | HR 55 | Temp 96.8°F | Resp 16 | Ht 70.0 in | Wt 179.0 lb

## 2016-05-26 DIAGNOSIS — Z1212 Encounter for screening for malignant neoplasm of rectum: Secondary | ICD-10-CM

## 2016-05-26 DIAGNOSIS — Z1211 Encounter for screening for malignant neoplasm of colon: Secondary | ICD-10-CM

## 2016-05-26 DIAGNOSIS — D120 Benign neoplasm of cecum: Secondary | ICD-10-CM

## 2016-05-26 DIAGNOSIS — D175 Benign lipomatous neoplasm of intra-abdominal organs: Secondary | ICD-10-CM | POA: Diagnosis not present

## 2016-05-26 MED ORDER — SODIUM CHLORIDE 0.9 % IV SOLN: 500.0000 mL | INTRAVENOUS | Status: AC

## 2016-05-26 NOTE — Progress Notes (Signed)
A/ox3 pleased with MAC, report to Annette RN 

## 2016-05-26 NOTE — Progress Notes (Signed)
No problems noted in the recovery room. maw 

## 2016-05-26 NOTE — Patient Instructions (Signed)
YOU HAD AN ENDOSCOPIC PROCEDURE TODAY AT [facility]:   Refer to the procedure report that was given to you for any specific questions about what was found during the examination.  If the procedure report does not answer your questions, please call your gastroenterologist to clarify.  If you requested that your care partner not be given the details of your procedure findings, then the procedure report has been included in a sealed envelope for you to review at your convenience later.  YOU SHOULD EXPECT: Some feelings of bloating in the abdomen. Passage of more gas than usual.  Walking can help get rid of the air that was put into your GI tract during the procedure and reduce the bloating. If you had a lower endoscopy (such as a colonoscopy or flexible sigmoidoscopy) you may notice spotting of blood in your stool or on the toilet paper. If you underwent a bowel prep for your procedure, you may not have a normal bowel movement for a few days.  Please Note:  You might notice some irritation and congestion in your nose or some drainage.  This is from the oxygen used during your procedure.  There is no need for concern and it should clear up in a day or so.  SYMPTOMS TO REPORT IMMEDIATELY:   Following lower endoscopy (colonoscopy or flexible sigmoidoscopy):  Excessive amounts of blood in the stool  Significant tenderness or worsening of abdominal pains  Swelling of the abdomen that is new, acute  Fever of 100F or higher   Following upper endoscopy (EGD)  Vomiting of blood or coffee ground material  New chest pain or pain under the shoulder blades  Painful or persistently difficult swallowing  New shortness of breath  Fever of 100F or higher  Black, tarry-looking stools  For urgent or emergent issues, a gastroenterologist can be reached at any hour by calling [phone].   DIET:  We do recommend a small meal at first, but then you may proceed to your regular diet.  Drink  plenty of fluids but you should avoid alcoholic beverages for 24 hours.  ACTIVITY:  You should plan to take it easy for the rest of today and you should NOT DRIVE or use heavy machinery until tomorrow (because of the sedation medicines used during the test).    FOLLOW UP: Our staff will call the number listed on your records the next business day following your procedure to check on you and address any questions or concerns that you may have regarding the information given to you following your procedure. If we do not reach you, we will leave a message.  However, if you are feeling well and you are not experiencing any problems, there is no need to return our call.  We will assume that you have returned to your regular daily activities without incident.  If any biopsies were taken you will be contacted by phone or by letter within the next 1-3 weeks.  Please call us at [phone] if you have not heard about the biopsies in 3 weeks.    SIGNATURES/CONFIDENTIALITY: You and/or your care partner have signed paperwork which will be entered into your electronic medical record.  These signatures attest to the fact that that the information above on your After Visit Summary has been reviewed and is understood.  Full responsibility of the confidentiality of this discharge information lies with you and/or your care-partner.    Handouts were given to your care partner on polyps  and hemorrhoids. No aspirin, aspirin products,  ibuprofen, naproxen, advil, motrin, aleve, or other non-steroidal anti-inflammatory drugs for 14 days after polyp removal. You may resume your other current medications today. Await biopsy results. Please call if any questions or concerns.

## 2016-05-26 NOTE — Op Note (Signed)
[place] Patient Name: Glen Parker Procedure Date: 05/26/2016 11:00 AM MRN: [id number] Endoscopist: Ladene Artist , MD Age: 65 Referring MD:  Date of Birth: [birth year] Gender: Male Account #: [id number] Procedure:                Colonoscopy Indications:              Screening for colorectal malignant neoplasm, Last                            colonoscopy 10 years ago Medicines:                Monitored Anesthesia Care Procedure:                Pre-Anesthesia Assessment:                           - Prior to the procedure, a History and Physical                            was performed, and patient medications and                            allergies were reviewed. The patient's tolerance of                            previous anesthesia was also reviewed. The risks                            and benefits of the procedure and the sedation                            options and risks were discussed with the patient.                            All questions were answered, and informed consent                            was obtained. Prior Anticoagulants: The patient has                            taken no previous anticoagulant or antiplatelet                            agents. ASA Grade Assessment: II - A patient with                            mild systemic disease. After reviewing the risks                            and benefits, the patient was deemed in                            satisfactory condition to undergo the procedure.  After obtaining informed consent, the colonoscope                            was passed under direct vision. Throughout the                            procedure, the patient's blood pressure, pulse, and                            oxygen saturations were monitored continuously. The                            Model PCF-[id number]) scope was introduced                            through the anus and advanced to  the the cecum,                            identified by appendiceal orifice and ileocecal                            valve. The ileocecal valve, appendiceal orifice,                            and rectum were photographed. The quality of the                            bowel preparation was good. The colonoscopy was                            performed without difficulty. The patient tolerated                            the procedure well. Scope In: [id number] AM Scope Out: 11:24:18 AM Scope Withdrawal Time: 0 hours 14 minutes 39 seconds  Total Procedure Duration: 0 hours 18 minutes 4 seconds  Findings:                 The perianal and digital rectal examinations were                            normal.                           A 10 mm polyp was found in the ileocecal valve. The                            polyp was sessile. The polyp was removed with a hot                            snare. Resection and retrieval were complete.                           Internal hemorrhoids were found during  retroflexion. The hemorrhoids were medium-sized and                            Grade I (internal hemorrhoids that do not prolapse).                           The exam was otherwise without abnormality on                            direct and retroflexion views. Complications:            No immediate complications. Estimated blood loss:                            None. Estimated Blood Loss:     Estimated blood loss: none. Impression:               - One 10 mm polyp at the ileocecal valve, removed                            with a hot snare. Resected and retrieved.                           - Internal hemorrhoids.                           - The examination was otherwise normal on direct                            and retroflexion views. Recommendation:           - Repeat colonoscopy in 5 years for surveillance.                           - Patient has a contact number available  for                            emergencies. The signs and symptoms of potential                            delayed complications were discussed with the                            patient. Return to normal activities tomorrow.                            Written discharge instructions were provided to the                            patient.                           - Resume previous diet.                           - Continue present medications.                           -  Await pathology results.                           - No aspirin, ibuprofen, naproxen, or other                            non-steroidal anti-inflammatory drugs for 2 weeks                            after polyp removal. Ladene Artist, MD 05/26/2016 11:27:14 AM This report has been signed electronically.

## 2016-05-26 NOTE — Progress Notes (Signed)
Called to room to assist during endoscopic procedure.  Patient ID and intended procedure confirmed with present staff. Received instructions for my participation in the procedure from the performing physician.  

## 2016-05-27 ENCOUNTER — Telehealth: Payer: Self-pay | Admitting: *Deleted

## 2016-05-27 ENCOUNTER — Telehealth: Payer: Self-pay

## 2016-05-27 NOTE — Telephone Encounter (Signed)
  Follow up Call-  Call back number 05/26/2016  Post procedure Call Back phone  # [phone]  Permission to leave phone message Yes  Some recent data might be hidden     No answer. Left message to call with any questions or concerns. SMonday RN

## 2016-05-27 NOTE — Telephone Encounter (Signed)
  Follow up Call-  Call back number 05/26/2016  Post procedure Call Back phone  # [phone]  Permission to leave phone message Yes  Some recent data might be hidden    Patient was called for follow up after his procedure on 05/26/2016. No answer at the number given for follow up phone call. A message was left on the answering machine.

## 2016-06-06 ENCOUNTER — Encounter: Payer: Self-pay | Admitting: Gastroenterology

## 2016-06-08 DIAGNOSIS — Z6824 Body mass index (BMI) 24.0-24.9, adult: Secondary | ICD-10-CM | POA: Diagnosis not present

## 2016-06-08 DIAGNOSIS — H4010X0 Unspecified open-angle glaucoma, stage unspecified: Secondary | ICD-10-CM | POA: Diagnosis not present

## 2016-06-08 DIAGNOSIS — Z0000 Encounter for general adult medical examination without abnormal findings: Secondary | ICD-10-CM | POA: Diagnosis not present

## 2016-10-20 DIAGNOSIS — H401132 Primary open-angle glaucoma, bilateral, moderate stage: Secondary | ICD-10-CM | POA: Diagnosis not present

## 2016-10-27 DIAGNOSIS — D2272 Melanocytic nevi of left lower limb, including hip: Secondary | ICD-10-CM | POA: Diagnosis not present

## 2016-10-27 DIAGNOSIS — L570 Actinic keratosis: Secondary | ICD-10-CM | POA: Diagnosis not present

## 2016-10-27 DIAGNOSIS — D225 Melanocytic nevi of trunk: Secondary | ICD-10-CM | POA: Diagnosis not present

## 2016-10-27 DIAGNOSIS — D2261 Melanocytic nevi of right upper limb, including shoulder: Secondary | ICD-10-CM | POA: Diagnosis not present

## 2016-10-27 DIAGNOSIS — L821 Other seborrheic keratosis: Secondary | ICD-10-CM | POA: Diagnosis not present

## 2016-10-27 DIAGNOSIS — S30861A Insect bite (nonvenomous) of abdominal wall, initial encounter: Secondary | ICD-10-CM | POA: Diagnosis not present

## 2016-10-27 DIAGNOSIS — D485 Neoplasm of uncertain behavior of skin: Secondary | ICD-10-CM | POA: Diagnosis not present

## 2016-11-02 DIAGNOSIS — Z0000 Encounter for general adult medical examination without abnormal findings: Secondary | ICD-10-CM | POA: Diagnosis not present

## 2016-11-02 DIAGNOSIS — Z125 Encounter for screening for malignant neoplasm of prostate: Secondary | ICD-10-CM | POA: Diagnosis not present

## 2016-11-07 DIAGNOSIS — Z0000 Encounter for general adult medical examination without abnormal findings: Secondary | ICD-10-CM | POA: Diagnosis not present

## 2016-11-07 DIAGNOSIS — Z23 Encounter for immunization: Secondary | ICD-10-CM | POA: Diagnosis not present

## 2016-11-07 DIAGNOSIS — E7800 Pure hypercholesterolemia, unspecified: Secondary | ICD-10-CM | POA: Diagnosis not present

## 2016-11-07 DIAGNOSIS — R972 Elevated prostate specific antigen [PSA]: Secondary | ICD-10-CM | POA: Diagnosis not present

## 2017-01-25 DIAGNOSIS — R69 Illness, unspecified: Secondary | ICD-10-CM | POA: Diagnosis not present

## 2017-01-25 DIAGNOSIS — R972 Elevated prostate specific antigen [PSA]: Secondary | ICD-10-CM | POA: Diagnosis not present

## 2017-01-30 DIAGNOSIS — R69 Illness, unspecified: Secondary | ICD-10-CM | POA: Diagnosis not present

## 2017-02-01 DIAGNOSIS — N402 Nodular prostate without lower urinary tract symptoms: Secondary | ICD-10-CM | POA: Diagnosis not present

## 2017-02-01 DIAGNOSIS — R972 Elevated prostate specific antigen [PSA]: Secondary | ICD-10-CM | POA: Diagnosis not present

## 2017-03-08 DIAGNOSIS — M5137 Other intervertebral disc degeneration, lumbosacral region: Secondary | ICD-10-CM | POA: Diagnosis not present

## 2017-03-08 DIAGNOSIS — M9904 Segmental and somatic dysfunction of sacral region: Secondary | ICD-10-CM | POA: Diagnosis not present

## 2017-03-08 DIAGNOSIS — M47816 Spondylosis without myelopathy or radiculopathy, lumbar region: Secondary | ICD-10-CM | POA: Diagnosis not present

## 2017-03-08 DIAGNOSIS — M9903 Segmental and somatic dysfunction of lumbar region: Secondary | ICD-10-CM | POA: Diagnosis not present

## 2017-03-09 DIAGNOSIS — M9903 Segmental and somatic dysfunction of lumbar region: Secondary | ICD-10-CM | POA: Diagnosis not present

## 2017-03-09 DIAGNOSIS — M5137 Other intervertebral disc degeneration, lumbosacral region: Secondary | ICD-10-CM | POA: Diagnosis not present

## 2017-03-09 DIAGNOSIS — M9904 Segmental and somatic dysfunction of sacral region: Secondary | ICD-10-CM | POA: Diagnosis not present

## 2017-03-09 DIAGNOSIS — M47816 Spondylosis without myelopathy or radiculopathy, lumbar region: Secondary | ICD-10-CM | POA: Diagnosis not present

## 2017-03-14 DIAGNOSIS — M5137 Other intervertebral disc degeneration, lumbosacral region: Secondary | ICD-10-CM | POA: Diagnosis not present

## 2017-03-14 DIAGNOSIS — M9903 Segmental and somatic dysfunction of lumbar region: Secondary | ICD-10-CM | POA: Diagnosis not present

## 2017-03-14 DIAGNOSIS — M47816 Spondylosis without myelopathy or radiculopathy, lumbar region: Secondary | ICD-10-CM | POA: Diagnosis not present

## 2017-03-14 DIAGNOSIS — M9904 Segmental and somatic dysfunction of sacral region: Secondary | ICD-10-CM | POA: Diagnosis not present

## 2017-03-15 DIAGNOSIS — M5137 Other intervertebral disc degeneration, lumbosacral region: Secondary | ICD-10-CM | POA: Diagnosis not present

## 2017-03-15 DIAGNOSIS — M47816 Spondylosis without myelopathy or radiculopathy, lumbar region: Secondary | ICD-10-CM | POA: Diagnosis not present

## 2017-03-15 DIAGNOSIS — M9903 Segmental and somatic dysfunction of lumbar region: Secondary | ICD-10-CM | POA: Diagnosis not present

## 2017-03-15 DIAGNOSIS — M9904 Segmental and somatic dysfunction of sacral region: Secondary | ICD-10-CM | POA: Diagnosis not present

## 2017-03-16 DIAGNOSIS — M5137 Other intervertebral disc degeneration, lumbosacral region: Secondary | ICD-10-CM | POA: Diagnosis not present

## 2017-03-16 DIAGNOSIS — M47816 Spondylosis without myelopathy or radiculopathy, lumbar region: Secondary | ICD-10-CM | POA: Diagnosis not present

## 2017-03-16 DIAGNOSIS — M9904 Segmental and somatic dysfunction of sacral region: Secondary | ICD-10-CM | POA: Diagnosis not present

## 2017-03-16 DIAGNOSIS — M9903 Segmental and somatic dysfunction of lumbar region: Secondary | ICD-10-CM | POA: Diagnosis not present

## 2017-03-20 DIAGNOSIS — M5137 Other intervertebral disc degeneration, lumbosacral region: Secondary | ICD-10-CM | POA: Diagnosis not present

## 2017-03-20 DIAGNOSIS — M9904 Segmental and somatic dysfunction of sacral region: Secondary | ICD-10-CM | POA: Diagnosis not present

## 2017-03-20 DIAGNOSIS — M47816 Spondylosis without myelopathy or radiculopathy, lumbar region: Secondary | ICD-10-CM | POA: Diagnosis not present

## 2017-03-20 DIAGNOSIS — M9903 Segmental and somatic dysfunction of lumbar region: Secondary | ICD-10-CM | POA: Diagnosis not present

## 2017-03-22 DIAGNOSIS — M9904 Segmental and somatic dysfunction of sacral region: Secondary | ICD-10-CM | POA: Diagnosis not present

## 2017-03-22 DIAGNOSIS — M9903 Segmental and somatic dysfunction of lumbar region: Secondary | ICD-10-CM | POA: Diagnosis not present

## 2017-03-22 DIAGNOSIS — M47816 Spondylosis without myelopathy or radiculopathy, lumbar region: Secondary | ICD-10-CM | POA: Diagnosis not present

## 2017-03-22 DIAGNOSIS — M5137 Other intervertebral disc degeneration, lumbosacral region: Secondary | ICD-10-CM | POA: Diagnosis not present

## 2017-03-23 DIAGNOSIS — M47816 Spondylosis without myelopathy or radiculopathy, lumbar region: Secondary | ICD-10-CM | POA: Diagnosis not present

## 2017-03-23 DIAGNOSIS — M9903 Segmental and somatic dysfunction of lumbar region: Secondary | ICD-10-CM | POA: Diagnosis not present

## 2017-03-23 DIAGNOSIS — M9904 Segmental and somatic dysfunction of sacral region: Secondary | ICD-10-CM | POA: Diagnosis not present

## 2017-03-23 DIAGNOSIS — M5137 Other intervertebral disc degeneration, lumbosacral region: Secondary | ICD-10-CM | POA: Diagnosis not present

## 2017-03-27 DIAGNOSIS — M47816 Spondylosis without myelopathy or radiculopathy, lumbar region: Secondary | ICD-10-CM | POA: Diagnosis not present

## 2017-03-27 DIAGNOSIS — M9904 Segmental and somatic dysfunction of sacral region: Secondary | ICD-10-CM | POA: Diagnosis not present

## 2017-03-27 DIAGNOSIS — M9903 Segmental and somatic dysfunction of lumbar region: Secondary | ICD-10-CM | POA: Diagnosis not present

## 2017-03-27 DIAGNOSIS — M5137 Other intervertebral disc degeneration, lumbosacral region: Secondary | ICD-10-CM | POA: Diagnosis not present

## 2017-03-29 DIAGNOSIS — M47816 Spondylosis without myelopathy or radiculopathy, lumbar region: Secondary | ICD-10-CM | POA: Diagnosis not present

## 2017-03-29 DIAGNOSIS — M9904 Segmental and somatic dysfunction of sacral region: Secondary | ICD-10-CM | POA: Diagnosis not present

## 2017-03-29 DIAGNOSIS — M9903 Segmental and somatic dysfunction of lumbar region: Secondary | ICD-10-CM | POA: Diagnosis not present

## 2017-03-29 DIAGNOSIS — M5137 Other intervertebral disc degeneration, lumbosacral region: Secondary | ICD-10-CM | POA: Diagnosis not present

## 2017-03-30 DIAGNOSIS — M9903 Segmental and somatic dysfunction of lumbar region: Secondary | ICD-10-CM | POA: Diagnosis not present

## 2017-03-30 DIAGNOSIS — M47816 Spondylosis without myelopathy or radiculopathy, lumbar region: Secondary | ICD-10-CM | POA: Diagnosis not present

## 2017-03-30 DIAGNOSIS — M5137 Other intervertebral disc degeneration, lumbosacral region: Secondary | ICD-10-CM | POA: Diagnosis not present

## 2017-03-30 DIAGNOSIS — M9904 Segmental and somatic dysfunction of sacral region: Secondary | ICD-10-CM | POA: Diagnosis not present

## 2017-04-03 DIAGNOSIS — M47816 Spondylosis without myelopathy or radiculopathy, lumbar region: Secondary | ICD-10-CM | POA: Diagnosis not present

## 2017-04-03 DIAGNOSIS — M9904 Segmental and somatic dysfunction of sacral region: Secondary | ICD-10-CM | POA: Diagnosis not present

## 2017-04-03 DIAGNOSIS — M5137 Other intervertebral disc degeneration, lumbosacral region: Secondary | ICD-10-CM | POA: Diagnosis not present

## 2017-04-03 DIAGNOSIS — M9903 Segmental and somatic dysfunction of lumbar region: Secondary | ICD-10-CM | POA: Diagnosis not present

## 2017-04-05 DIAGNOSIS — M5137 Other intervertebral disc degeneration, lumbosacral region: Secondary | ICD-10-CM | POA: Diagnosis not present

## 2017-04-05 DIAGNOSIS — M47816 Spondylosis without myelopathy or radiculopathy, lumbar region: Secondary | ICD-10-CM | POA: Diagnosis not present

## 2017-04-05 DIAGNOSIS — M9904 Segmental and somatic dysfunction of sacral region: Secondary | ICD-10-CM | POA: Diagnosis not present

## 2017-04-05 DIAGNOSIS — M9903 Segmental and somatic dysfunction of lumbar region: Secondary | ICD-10-CM | POA: Diagnosis not present

## 2017-04-10 DIAGNOSIS — M5137 Other intervertebral disc degeneration, lumbosacral region: Secondary | ICD-10-CM | POA: Diagnosis not present

## 2017-04-10 DIAGNOSIS — M9903 Segmental and somatic dysfunction of lumbar region: Secondary | ICD-10-CM | POA: Diagnosis not present

## 2017-04-10 DIAGNOSIS — M47816 Spondylosis without myelopathy or radiculopathy, lumbar region: Secondary | ICD-10-CM | POA: Diagnosis not present

## 2017-04-10 DIAGNOSIS — M9904 Segmental and somatic dysfunction of sacral region: Secondary | ICD-10-CM | POA: Diagnosis not present

## 2017-04-13 DIAGNOSIS — M47816 Spondylosis without myelopathy or radiculopathy, lumbar region: Secondary | ICD-10-CM | POA: Diagnosis not present

## 2017-04-13 DIAGNOSIS — M9904 Segmental and somatic dysfunction of sacral region: Secondary | ICD-10-CM | POA: Diagnosis not present

## 2017-04-13 DIAGNOSIS — M5137 Other intervertebral disc degeneration, lumbosacral region: Secondary | ICD-10-CM | POA: Diagnosis not present

## 2017-04-13 DIAGNOSIS — M9903 Segmental and somatic dysfunction of lumbar region: Secondary | ICD-10-CM | POA: Diagnosis not present

## 2017-04-17 DIAGNOSIS — M47816 Spondylosis without myelopathy or radiculopathy, lumbar region: Secondary | ICD-10-CM | POA: Diagnosis not present

## 2017-04-17 DIAGNOSIS — M9903 Segmental and somatic dysfunction of lumbar region: Secondary | ICD-10-CM | POA: Diagnosis not present

## 2017-04-17 DIAGNOSIS — M5137 Other intervertebral disc degeneration, lumbosacral region: Secondary | ICD-10-CM | POA: Diagnosis not present

## 2017-04-17 DIAGNOSIS — M9904 Segmental and somatic dysfunction of sacral region: Secondary | ICD-10-CM | POA: Diagnosis not present

## 2017-04-20 DIAGNOSIS — M9904 Segmental and somatic dysfunction of sacral region: Secondary | ICD-10-CM | POA: Diagnosis not present

## 2017-04-20 DIAGNOSIS — M47816 Spondylosis without myelopathy or radiculopathy, lumbar region: Secondary | ICD-10-CM | POA: Diagnosis not present

## 2017-04-20 DIAGNOSIS — M5137 Other intervertebral disc degeneration, lumbosacral region: Secondary | ICD-10-CM | POA: Diagnosis not present

## 2017-04-20 DIAGNOSIS — M9903 Segmental and somatic dysfunction of lumbar region: Secondary | ICD-10-CM | POA: Diagnosis not present

## 2017-04-24 DIAGNOSIS — M9904 Segmental and somatic dysfunction of sacral region: Secondary | ICD-10-CM | POA: Diagnosis not present

## 2017-04-24 DIAGNOSIS — M5137 Other intervertebral disc degeneration, lumbosacral region: Secondary | ICD-10-CM | POA: Diagnosis not present

## 2017-04-24 DIAGNOSIS — M9903 Segmental and somatic dysfunction of lumbar region: Secondary | ICD-10-CM | POA: Diagnosis not present

## 2017-04-24 DIAGNOSIS — M47816 Spondylosis without myelopathy or radiculopathy, lumbar region: Secondary | ICD-10-CM | POA: Diagnosis not present

## 2017-04-26 DIAGNOSIS — H524 Presbyopia: Secondary | ICD-10-CM | POA: Diagnosis not present

## 2017-04-27 DIAGNOSIS — M47816 Spondylosis without myelopathy or radiculopathy, lumbar region: Secondary | ICD-10-CM | POA: Diagnosis not present

## 2017-04-27 DIAGNOSIS — M5137 Other intervertebral disc degeneration, lumbosacral region: Secondary | ICD-10-CM | POA: Diagnosis not present

## 2017-04-27 DIAGNOSIS — M9904 Segmental and somatic dysfunction of sacral region: Secondary | ICD-10-CM | POA: Diagnosis not present

## 2017-04-27 DIAGNOSIS — M9903 Segmental and somatic dysfunction of lumbar region: Secondary | ICD-10-CM | POA: Diagnosis not present

## 2017-05-01 DIAGNOSIS — M5137 Other intervertebral disc degeneration, lumbosacral region: Secondary | ICD-10-CM | POA: Diagnosis not present

## 2017-05-01 DIAGNOSIS — M9903 Segmental and somatic dysfunction of lumbar region: Secondary | ICD-10-CM | POA: Diagnosis not present

## 2017-05-01 DIAGNOSIS — M47816 Spondylosis without myelopathy or radiculopathy, lumbar region: Secondary | ICD-10-CM | POA: Diagnosis not present

## 2017-05-01 DIAGNOSIS — M9904 Segmental and somatic dysfunction of sacral region: Secondary | ICD-10-CM | POA: Diagnosis not present

## 2017-05-04 DIAGNOSIS — M5137 Other intervertebral disc degeneration, lumbosacral region: Secondary | ICD-10-CM | POA: Diagnosis not present

## 2017-05-04 DIAGNOSIS — M9904 Segmental and somatic dysfunction of sacral region: Secondary | ICD-10-CM | POA: Diagnosis not present

## 2017-05-04 DIAGNOSIS — M47816 Spondylosis without myelopathy or radiculopathy, lumbar region: Secondary | ICD-10-CM | POA: Diagnosis not present

## 2017-05-04 DIAGNOSIS — M9903 Segmental and somatic dysfunction of lumbar region: Secondary | ICD-10-CM | POA: Diagnosis not present

## 2017-05-08 DIAGNOSIS — M9903 Segmental and somatic dysfunction of lumbar region: Secondary | ICD-10-CM | POA: Diagnosis not present

## 2017-05-08 DIAGNOSIS — M9904 Segmental and somatic dysfunction of sacral region: Secondary | ICD-10-CM | POA: Diagnosis not present

## 2017-05-08 DIAGNOSIS — M47816 Spondylosis without myelopathy or radiculopathy, lumbar region: Secondary | ICD-10-CM | POA: Diagnosis not present

## 2017-05-08 DIAGNOSIS — M5137 Other intervertebral disc degeneration, lumbosacral region: Secondary | ICD-10-CM | POA: Diagnosis not present

## 2017-05-10 DIAGNOSIS — M259 Joint disorder, unspecified: Secondary | ICD-10-CM | POA: Diagnosis not present

## 2017-05-10 DIAGNOSIS — Z23 Encounter for immunization: Secondary | ICD-10-CM | POA: Diagnosis not present

## 2017-05-10 DIAGNOSIS — Z0000 Encounter for general adult medical examination without abnormal findings: Secondary | ICD-10-CM | POA: Diagnosis not present

## 2017-05-15 DIAGNOSIS — M9903 Segmental and somatic dysfunction of lumbar region: Secondary | ICD-10-CM | POA: Diagnosis not present

## 2017-05-15 DIAGNOSIS — M9904 Segmental and somatic dysfunction of sacral region: Secondary | ICD-10-CM | POA: Diagnosis not present

## 2017-05-15 DIAGNOSIS — M47816 Spondylosis without myelopathy or radiculopathy, lumbar region: Secondary | ICD-10-CM | POA: Diagnosis not present

## 2017-05-15 DIAGNOSIS — M5137 Other intervertebral disc degeneration, lumbosacral region: Secondary | ICD-10-CM | POA: Diagnosis not present

## 2017-05-22 DIAGNOSIS — M9904 Segmental and somatic dysfunction of sacral region: Secondary | ICD-10-CM | POA: Diagnosis not present

## 2017-05-22 DIAGNOSIS — M9903 Segmental and somatic dysfunction of lumbar region: Secondary | ICD-10-CM | POA: Diagnosis not present

## 2017-05-22 DIAGNOSIS — M5137 Other intervertebral disc degeneration, lumbosacral region: Secondary | ICD-10-CM | POA: Diagnosis not present

## 2017-05-22 DIAGNOSIS — M47816 Spondylosis without myelopathy or radiculopathy, lumbar region: Secondary | ICD-10-CM | POA: Diagnosis not present

## 2017-05-30 DIAGNOSIS — M5432 Sciatica, left side: Secondary | ICD-10-CM | POA: Diagnosis not present

## 2017-05-30 DIAGNOSIS — M5417 Radiculopathy, lumbosacral region: Secondary | ICD-10-CM | POA: Diagnosis not present

## 2017-05-30 DIAGNOSIS — M79605 Pain in left leg: Secondary | ICD-10-CM | POA: Diagnosis not present

## 2017-06-01 DIAGNOSIS — M79605 Pain in left leg: Secondary | ICD-10-CM | POA: Diagnosis not present

## 2017-06-01 DIAGNOSIS — M5417 Radiculopathy, lumbosacral region: Secondary | ICD-10-CM | POA: Diagnosis not present

## 2017-06-16 DIAGNOSIS — M5432 Sciatica, left side: Secondary | ICD-10-CM | POA: Diagnosis not present

## 2017-10-19 DIAGNOSIS — H409 Unspecified glaucoma: Secondary | ICD-10-CM | POA: Diagnosis not present

## 2017-10-19 DIAGNOSIS — N529 Male erectile dysfunction, unspecified: Secondary | ICD-10-CM | POA: Diagnosis not present

## 2017-10-19 DIAGNOSIS — Z8249 Family history of ischemic heart disease and other diseases of the circulatory system: Secondary | ICD-10-CM | POA: Diagnosis not present

## 2017-10-30 DIAGNOSIS — H401132 Primary open-angle glaucoma, bilateral, moderate stage: Secondary | ICD-10-CM | POA: Diagnosis not present

## 2017-11-02 DIAGNOSIS — D225 Melanocytic nevi of trunk: Secondary | ICD-10-CM | POA: Diagnosis not present

## 2017-11-02 DIAGNOSIS — L820 Inflamed seborrheic keratosis: Secondary | ICD-10-CM | POA: Diagnosis not present

## 2017-11-02 DIAGNOSIS — D2272 Melanocytic nevi of left lower limb, including hip: Secondary | ICD-10-CM | POA: Diagnosis not present

## 2017-11-02 DIAGNOSIS — L821 Other seborrheic keratosis: Secondary | ICD-10-CM | POA: Diagnosis not present

## 2017-11-02 DIAGNOSIS — L918 Other hypertrophic disorders of the skin: Secondary | ICD-10-CM | POA: Diagnosis not present

## 2017-11-02 DIAGNOSIS — I788 Other diseases of capillaries: Secondary | ICD-10-CM | POA: Diagnosis not present

## 2017-11-02 DIAGNOSIS — D1801 Hemangioma of skin and subcutaneous tissue: Secondary | ICD-10-CM | POA: Diagnosis not present

## 2017-11-02 DIAGNOSIS — D2261 Melanocytic nevi of right upper limb, including shoulder: Secondary | ICD-10-CM | POA: Diagnosis not present

## 2017-11-06 DIAGNOSIS — Z125 Encounter for screening for malignant neoplasm of prostate: Secondary | ICD-10-CM | POA: Diagnosis not present

## 2017-11-06 DIAGNOSIS — E7800 Pure hypercholesterolemia, unspecified: Secondary | ICD-10-CM | POA: Diagnosis not present

## 2017-11-06 DIAGNOSIS — Z0000 Encounter for general adult medical examination without abnormal findings: Secondary | ICD-10-CM | POA: Diagnosis not present

## 2017-11-13 DIAGNOSIS — Z0000 Encounter for general adult medical examination without abnormal findings: Secondary | ICD-10-CM | POA: Diagnosis not present

## 2017-11-27 DIAGNOSIS — H401132 Primary open-angle glaucoma, bilateral, moderate stage: Secondary | ICD-10-CM | POA: Diagnosis not present

## 2017-12-29 DIAGNOSIS — H2513 Age-related nuclear cataract, bilateral: Secondary | ICD-10-CM | POA: Diagnosis not present

## 2017-12-29 DIAGNOSIS — H401132 Primary open-angle glaucoma, bilateral, moderate stage: Secondary | ICD-10-CM | POA: Diagnosis not present

## 2017-12-29 DIAGNOSIS — H01006 Unspecified blepharitis left eye, unspecified eyelid: Secondary | ICD-10-CM | POA: Diagnosis not present

## 2017-12-29 DIAGNOSIS — H18413 Arcus senilis, bilateral: Secondary | ICD-10-CM | POA: Diagnosis not present

## 2018-01-24 DIAGNOSIS — R69 Illness, unspecified: Secondary | ICD-10-CM | POA: Diagnosis not present

## 2018-01-29 DIAGNOSIS — R972 Elevated prostate specific antigen [PSA]: Secondary | ICD-10-CM | POA: Diagnosis not present

## 2018-02-05 DIAGNOSIS — N402 Nodular prostate without lower urinary tract symptoms: Secondary | ICD-10-CM | POA: Diagnosis not present

## 2018-02-05 DIAGNOSIS — R972 Elevated prostate specific antigen [PSA]: Secondary | ICD-10-CM | POA: Diagnosis not present

## 2018-03-05 DIAGNOSIS — M25561 Pain in right knee: Secondary | ICD-10-CM | POA: Diagnosis not present

## 2018-03-06 DIAGNOSIS — M222X1 Patellofemoral disorders, right knee: Secondary | ICD-10-CM | POA: Diagnosis not present

## 2018-03-30 DIAGNOSIS — Z23 Encounter for immunization: Secondary | ICD-10-CM | POA: Diagnosis not present

## 2018-04-27 DIAGNOSIS — H5213 Myopia, bilateral: Secondary | ICD-10-CM | POA: Diagnosis not present

## 2018-11-07 DIAGNOSIS — L720 Epidermal cyst: Secondary | ICD-10-CM | POA: Diagnosis not present

## 2018-11-07 DIAGNOSIS — L570 Actinic keratosis: Secondary | ICD-10-CM | POA: Diagnosis not present

## 2018-11-07 DIAGNOSIS — S30860A Insect bite (nonvenomous) of lower back and pelvis, initial encounter: Secondary | ICD-10-CM | POA: Diagnosis not present

## 2018-11-07 DIAGNOSIS — D2272 Melanocytic nevi of left lower limb, including hip: Secondary | ICD-10-CM | POA: Diagnosis not present

## 2018-11-07 DIAGNOSIS — D225 Melanocytic nevi of trunk: Secondary | ICD-10-CM | POA: Diagnosis not present

## 2018-11-07 DIAGNOSIS — L821 Other seborrheic keratosis: Secondary | ICD-10-CM | POA: Diagnosis not present

## 2018-11-12 DIAGNOSIS — E7800 Pure hypercholesterolemia, unspecified: Secondary | ICD-10-CM | POA: Diagnosis not present

## 2018-11-12 DIAGNOSIS — Z125 Encounter for screening for malignant neoplasm of prostate: Secondary | ICD-10-CM | POA: Diagnosis not present

## 2018-11-12 DIAGNOSIS — Z0000 Encounter for general adult medical examination without abnormal findings: Secondary | ICD-10-CM | POA: Diagnosis not present

## 2018-11-19 DIAGNOSIS — E079 Disorder of thyroid, unspecified: Secondary | ICD-10-CM | POA: Diagnosis not present

## 2018-11-19 DIAGNOSIS — Z0000 Encounter for general adult medical examination without abnormal findings: Secondary | ICD-10-CM | POA: Diagnosis not present

## 2018-11-19 DIAGNOSIS — M5432 Sciatica, left side: Secondary | ICD-10-CM | POA: Diagnosis not present

## 2018-11-19 DIAGNOSIS — R972 Elevated prostate specific antigen [PSA]: Secondary | ICD-10-CM | POA: Diagnosis not present

## 2018-11-19 DIAGNOSIS — E781 Pure hyperglyceridemia: Secondary | ICD-10-CM | POA: Diagnosis not present

## 2018-12-31 DIAGNOSIS — H401132 Primary open-angle glaucoma, bilateral, moderate stage: Secondary | ICD-10-CM | POA: Diagnosis not present

## 2019-02-04 DIAGNOSIS — R972 Elevated prostate specific antigen [PSA]: Secondary | ICD-10-CM | POA: Diagnosis not present

## 2019-02-07 DIAGNOSIS — N402 Nodular prostate without lower urinary tract symptoms: Secondary | ICD-10-CM | POA: Diagnosis not present

## 2019-02-07 DIAGNOSIS — R972 Elevated prostate specific antigen [PSA]: Secondary | ICD-10-CM | POA: Diagnosis not present

## 2019-02-07 DIAGNOSIS — R3912 Poor urinary stream: Secondary | ICD-10-CM | POA: Diagnosis not present

## 2019-02-19 DIAGNOSIS — R69 Illness, unspecified: Secondary | ICD-10-CM | POA: Diagnosis not present

## 2019-04-12 DIAGNOSIS — R69 Illness, unspecified: Secondary | ICD-10-CM | POA: Diagnosis not present

## 2019-07-02 DIAGNOSIS — H524 Presbyopia: Secondary | ICD-10-CM | POA: Diagnosis not present

## 2019-08-10 ENCOUNTER — Ambulatory Visit: Payer: Medicare HMO

## 2019-08-15 ENCOUNTER — Ambulatory Visit: Payer: Medicare HMO

## 2019-08-15 ENCOUNTER — Ambulatory Visit: Payer: Medicare HMO | Attending: Internal Medicine

## 2019-08-15 DIAGNOSIS — Z23 Encounter for immunization: Secondary | ICD-10-CM | POA: Insufficient documentation

## 2019-08-15 NOTE — Progress Notes (Signed)
   Covid-19 Vaccination Clinic  Name:  Glen Parker    MRN: [id number] DOB: 02-Aug-1950  08/15/2019  Glen Parker was observed post Covid-19 immunization for 15 minutes without incidence. He was provided with Vaccine Information Sheet and instruction to access the V-Safe system.   Glen Parker was instructed to call 911 with any severe reactions post vaccine: Marland Kitchen Difficulty breathing  . Swelling of your face and throat  . A fast heartbeat  . A bad rash all over your body  . Dizziness and weakness    Immunizations Administered    Name Date Dose VIS Date Route   Pfizer COVID-19 Vaccine 08/15/2019  8:50 AM 0.3 mL 06/21/2019 Intramuscular   Manufacturer: [place]   Lot: [id number]   [place]: [id number]

## 2019-09-09 ENCOUNTER — Ambulatory Visit: Payer: Medicare HMO | Attending: Internal Medicine

## 2019-09-09 DIAGNOSIS — Z23 Encounter for immunization: Secondary | ICD-10-CM | POA: Insufficient documentation

## 2019-09-09 NOTE — Progress Notes (Signed)
   Covid-19 Vaccination Clinic  Name:  Glen Parker    MRN: [id number] DOB: 10-Jan-1951  09/09/2019  [name] was observed post Covid-19 immunization for 15 minutes without incidence. He was provided with Vaccine Information Sheet and instruction to access the V-Safe system.   [name] was instructed to call 911 with any severe reactions post vaccine: Marland Kitchen Difficulty breathing  . Swelling of your face and throat  . A fast heartbeat  . A bad rash all over your body  . Dizziness and weakness    Immunizations Administered    Name Date Dose VIS Date Route   Pfizer COVID-19 Vaccine 09/09/2019  9:00 AM 0.3 mL 06/21/2019 Intramuscular   Manufacturer: [place]   Lot: [id number]   [place]: [id number]

## 2019-11-12 DIAGNOSIS — L570 Actinic keratosis: Secondary | ICD-10-CM | POA: Diagnosis not present

## 2019-11-12 DIAGNOSIS — D2272 Melanocytic nevi of left lower limb, including hip: Secondary | ICD-10-CM | POA: Diagnosis not present

## 2019-11-12 DIAGNOSIS — L814 Other melanin hyperpigmentation: Secondary | ICD-10-CM | POA: Diagnosis not present

## 2019-11-12 DIAGNOSIS — L821 Other seborrheic keratosis: Secondary | ICD-10-CM | POA: Diagnosis not present

## 2019-11-12 DIAGNOSIS — D2261 Melanocytic nevi of right upper limb, including shoulder: Secondary | ICD-10-CM | POA: Diagnosis not present

## 2019-11-12 DIAGNOSIS — D1801 Hemangioma of skin and subcutaneous tissue: Secondary | ICD-10-CM | POA: Diagnosis not present

## 2019-11-12 DIAGNOSIS — D225 Melanocytic nevi of trunk: Secondary | ICD-10-CM | POA: Diagnosis not present

## 2019-11-13 DIAGNOSIS — E7800 Pure hypercholesterolemia, unspecified: Secondary | ICD-10-CM | POA: Diagnosis not present

## 2019-11-13 DIAGNOSIS — R972 Elevated prostate specific antigen [PSA]: Secondary | ICD-10-CM | POA: Diagnosis not present

## 2019-11-20 DIAGNOSIS — M5432 Sciatica, left side: Secondary | ICD-10-CM | POA: Diagnosis not present

## 2019-11-20 DIAGNOSIS — E781 Pure hyperglyceridemia: Secondary | ICD-10-CM | POA: Diagnosis not present

## 2019-11-20 DIAGNOSIS — Z0000 Encounter for general adult medical examination without abnormal findings: Secondary | ICD-10-CM | POA: Diagnosis not present

## 2019-11-20 DIAGNOSIS — E079 Disorder of thyroid, unspecified: Secondary | ICD-10-CM | POA: Diagnosis not present

## 2020-01-03 DIAGNOSIS — H18413 Arcus senilis, bilateral: Secondary | ICD-10-CM | POA: Diagnosis not present

## 2020-01-03 DIAGNOSIS — H01006 Unspecified blepharitis left eye, unspecified eyelid: Secondary | ICD-10-CM | POA: Diagnosis not present

## 2020-01-03 DIAGNOSIS — H2513 Age-related nuclear cataract, bilateral: Secondary | ICD-10-CM | POA: Diagnosis not present

## 2020-01-03 DIAGNOSIS — H401132 Primary open-angle glaucoma, bilateral, moderate stage: Secondary | ICD-10-CM | POA: Diagnosis not present

## 2020-02-03 DIAGNOSIS — R972 Elevated prostate specific antigen [PSA]: Secondary | ICD-10-CM | POA: Diagnosis not present

## 2020-02-10 DIAGNOSIS — N401 Enlarged prostate with lower urinary tract symptoms: Secondary | ICD-10-CM | POA: Diagnosis not present

## 2020-02-10 DIAGNOSIS — R3915 Urgency of urination: Secondary | ICD-10-CM | POA: Diagnosis not present

## 2020-02-10 DIAGNOSIS — R972 Elevated prostate specific antigen [PSA]: Secondary | ICD-10-CM | POA: Diagnosis not present

## 2020-02-26 DIAGNOSIS — R69 Illness, unspecified: Secondary | ICD-10-CM | POA: Diagnosis not present

## 2020-03-11 DIAGNOSIS — N529 Male erectile dysfunction, unspecified: Secondary | ICD-10-CM | POA: Diagnosis not present

## 2020-03-11 DIAGNOSIS — H409 Unspecified glaucoma: Secondary | ICD-10-CM | POA: Diagnosis not present

## 2020-03-24 DIAGNOSIS — R69 Illness, unspecified: Secondary | ICD-10-CM | POA: Diagnosis not present

## 2020-04-29 DIAGNOSIS — N390 Urinary tract infection, site not specified: Secondary | ICD-10-CM | POA: Diagnosis not present

## 2020-04-29 DIAGNOSIS — R350 Frequency of micturition: Secondary | ICD-10-CM | POA: Diagnosis not present

## 2020-05-05 DIAGNOSIS — R3912 Poor urinary stream: Secondary | ICD-10-CM | POA: Diagnosis not present

## 2020-05-05 DIAGNOSIS — N401 Enlarged prostate with lower urinary tract symptoms: Secondary | ICD-10-CM | POA: Diagnosis not present

## 2020-05-05 DIAGNOSIS — R972 Elevated prostate specific antigen [PSA]: Secondary | ICD-10-CM | POA: Diagnosis not present

## 2020-05-25 DIAGNOSIS — R972 Elevated prostate specific antigen [PSA]: Secondary | ICD-10-CM | POA: Diagnosis not present

## 2020-05-25 DIAGNOSIS — N401 Enlarged prostate with lower urinary tract symptoms: Secondary | ICD-10-CM | POA: Diagnosis not present

## 2020-05-25 DIAGNOSIS — R3912 Poor urinary stream: Secondary | ICD-10-CM | POA: Diagnosis not present

## 2020-07-02 DIAGNOSIS — H524 Presbyopia: Secondary | ICD-10-CM | POA: Diagnosis not present

## 2020-10-13 DIAGNOSIS — R3912 Poor urinary stream: Secondary | ICD-10-CM | POA: Diagnosis not present

## 2020-10-13 DIAGNOSIS — N5201 Erectile dysfunction due to arterial insufficiency: Secondary | ICD-10-CM | POA: Diagnosis not present

## 2020-10-13 DIAGNOSIS — R972 Elevated prostate specific antigen [PSA]: Secondary | ICD-10-CM | POA: Diagnosis not present

## 2020-10-14 DIAGNOSIS — E349 Endocrine disorder, unspecified: Secondary | ICD-10-CM | POA: Diagnosis not present

## 2020-10-14 DIAGNOSIS — R972 Elevated prostate specific antigen [PSA]: Secondary | ICD-10-CM | POA: Diagnosis not present

## 2020-10-27 DIAGNOSIS — N5201 Erectile dysfunction due to arterial insufficiency: Secondary | ICD-10-CM | POA: Diagnosis not present

## 2020-11-09 DIAGNOSIS — D2271 Melanocytic nevi of right lower limb, including hip: Secondary | ICD-10-CM | POA: Diagnosis not present

## 2020-11-09 DIAGNOSIS — L821 Other seborrheic keratosis: Secondary | ICD-10-CM | POA: Diagnosis not present

## 2020-11-09 DIAGNOSIS — D1801 Hemangioma of skin and subcutaneous tissue: Secondary | ICD-10-CM | POA: Diagnosis not present

## 2020-11-09 DIAGNOSIS — D225 Melanocytic nevi of trunk: Secondary | ICD-10-CM | POA: Diagnosis not present

## 2020-11-09 DIAGNOSIS — D2261 Melanocytic nevi of right upper limb, including shoulder: Secondary | ICD-10-CM | POA: Diagnosis not present

## 2020-11-09 DIAGNOSIS — L918 Other hypertrophic disorders of the skin: Secondary | ICD-10-CM | POA: Diagnosis not present

## 2020-11-09 DIAGNOSIS — L578 Other skin changes due to chronic exposure to nonionizing radiation: Secondary | ICD-10-CM | POA: Diagnosis not present

## 2020-11-09 DIAGNOSIS — L814 Other melanin hyperpigmentation: Secondary | ICD-10-CM | POA: Diagnosis not present

## 2020-11-09 DIAGNOSIS — D2272 Melanocytic nevi of left lower limb, including hip: Secondary | ICD-10-CM | POA: Diagnosis not present

## 2020-11-15 DIAGNOSIS — Z125 Encounter for screening for malignant neoplasm of prostate: Secondary | ICD-10-CM | POA: Diagnosis not present

## 2020-11-15 DIAGNOSIS — E781 Pure hyperglyceridemia: Secondary | ICD-10-CM | POA: Diagnosis not present

## 2020-11-15 DIAGNOSIS — E7800 Pure hypercholesterolemia, unspecified: Secondary | ICD-10-CM | POA: Diagnosis not present

## 2020-11-16 DIAGNOSIS — Z125 Encounter for screening for malignant neoplasm of prostate: Secondary | ICD-10-CM | POA: Diagnosis not present

## 2020-11-16 DIAGNOSIS — E7800 Pure hypercholesterolemia, unspecified: Secondary | ICD-10-CM | POA: Diagnosis not present

## 2020-11-16 DIAGNOSIS — E781 Pure hyperglyceridemia: Secondary | ICD-10-CM | POA: Diagnosis not present

## 2020-12-09 DIAGNOSIS — H0102B Squamous blepharitis left eye, upper and lower eyelids: Secondary | ICD-10-CM | POA: Diagnosis not present

## 2020-12-09 DIAGNOSIS — H401131 Primary open-angle glaucoma, bilateral, mild stage: Secondary | ICD-10-CM | POA: Diagnosis not present

## 2020-12-09 DIAGNOSIS — H2513 Age-related nuclear cataract, bilateral: Secondary | ICD-10-CM | POA: Diagnosis not present

## 2020-12-09 DIAGNOSIS — H0102A Squamous blepharitis right eye, upper and lower eyelids: Secondary | ICD-10-CM | POA: Diagnosis not present

## 2020-12-28 DIAGNOSIS — Z20822 Contact with and (suspected) exposure to covid-19: Secondary | ICD-10-CM | POA: Diagnosis not present

## 2020-12-28 DIAGNOSIS — Z03818 Encounter for observation for suspected exposure to other biological agents ruled out: Secondary | ICD-10-CM | POA: Diagnosis not present

## 2021-01-26 DIAGNOSIS — H409 Unspecified glaucoma: Secondary | ICD-10-CM | POA: Diagnosis not present

## 2021-01-26 DIAGNOSIS — R030 Elevated blood-pressure reading, without diagnosis of hypertension: Secondary | ICD-10-CM | POA: Diagnosis not present

## 2021-01-26 DIAGNOSIS — Z888 Allergy status to other drugs, medicaments and biological substances status: Secondary | ICD-10-CM | POA: Diagnosis not present

## 2021-01-26 DIAGNOSIS — N529 Male erectile dysfunction, unspecified: Secondary | ICD-10-CM | POA: Diagnosis not present

## 2021-03-23 DIAGNOSIS — R972 Elevated prostate specific antigen [PSA]: Secondary | ICD-10-CM | POA: Diagnosis not present

## 2021-03-29 DIAGNOSIS — R3915 Urgency of urination: Secondary | ICD-10-CM | POA: Diagnosis not present

## 2021-03-29 DIAGNOSIS — R3914 Feeling of incomplete bladder emptying: Secondary | ICD-10-CM | POA: Diagnosis not present

## 2021-03-29 DIAGNOSIS — N401 Enlarged prostate with lower urinary tract symptoms: Secondary | ICD-10-CM | POA: Diagnosis not present

## 2021-03-29 DIAGNOSIS — R972 Elevated prostate specific antigen [PSA]: Secondary | ICD-10-CM | POA: Diagnosis not present

## 2021-03-29 DIAGNOSIS — N5201 Erectile dysfunction due to arterial insufficiency: Secondary | ICD-10-CM | POA: Diagnosis not present

## 2021-03-30 DIAGNOSIS — Z125 Encounter for screening for malignant neoplasm of prostate: Secondary | ICD-10-CM | POA: Diagnosis not present

## 2021-03-30 DIAGNOSIS — E781 Pure hyperglyceridemia: Secondary | ICD-10-CM | POA: Diagnosis not present

## 2021-03-30 DIAGNOSIS — E7800 Pure hypercholesterolemia, unspecified: Secondary | ICD-10-CM | POA: Diagnosis not present

## 2021-04-06 DIAGNOSIS — I8393 Asymptomatic varicose veins of bilateral lower extremities: Secondary | ICD-10-CM | POA: Diagnosis not present

## 2021-04-06 DIAGNOSIS — E7800 Pure hypercholesterolemia, unspecified: Secondary | ICD-10-CM | POA: Diagnosis not present

## 2021-04-06 DIAGNOSIS — Z0001 Encounter for general adult medical examination with abnormal findings: Secondary | ICD-10-CM | POA: Diagnosis not present

## 2021-04-06 DIAGNOSIS — R972 Elevated prostate specific antigen [PSA]: Secondary | ICD-10-CM | POA: Diagnosis not present

## 2021-04-06 DIAGNOSIS — E079 Disorder of thyroid, unspecified: Secondary | ICD-10-CM | POA: Diagnosis not present

## 2021-04-06 DIAGNOSIS — E041 Nontoxic single thyroid nodule: Secondary | ICD-10-CM | POA: Diagnosis not present

## 2021-04-06 DIAGNOSIS — D72819 Decreased white blood cell count, unspecified: Secondary | ICD-10-CM | POA: Diagnosis not present

## 2021-04-06 DIAGNOSIS — M5432 Sciatica, left side: Secondary | ICD-10-CM | POA: Diagnosis not present

## 2021-04-06 DIAGNOSIS — M259 Joint disorder, unspecified: Secondary | ICD-10-CM | POA: Diagnosis not present

## 2021-04-06 DIAGNOSIS — M2041 Other hammer toe(s) (acquired), right foot: Secondary | ICD-10-CM | POA: Diagnosis not present

## 2021-04-07 ENCOUNTER — Other Ambulatory Visit: Payer: Self-pay | Admitting: Internal Medicine

## 2021-04-07 DIAGNOSIS — E7800 Pure hypercholesterolemia, unspecified: Secondary | ICD-10-CM

## 2021-04-08 ENCOUNTER — Other Ambulatory Visit: Payer: Self-pay | Admitting: Internal Medicine

## 2021-04-08 DIAGNOSIS — E041 Nontoxic single thyroid nodule: Secondary | ICD-10-CM

## 2021-04-16 ENCOUNTER — Ambulatory Visit
Admission: RE | Admit: 2021-04-16 | Discharge: 2021-04-16 | Disposition: A | Discharge status: Home / Self Care | Payer: Self-pay | Source: Ambulatory Visit | Attending: Internal Medicine | Admitting: Internal Medicine

## 2021-04-16 DIAGNOSIS — E041 Nontoxic single thyroid nodule: Secondary | ICD-10-CM

## 2021-04-16 DIAGNOSIS — E042 Nontoxic multinodular goiter: Secondary | ICD-10-CM | POA: Diagnosis not present

## 2021-04-23 ENCOUNTER — Other Ambulatory Visit: Payer: Self-pay

## 2021-04-23 ENCOUNTER — Ambulatory Visit
Admission: RE | Admit: 2021-04-23 | Discharge: 2021-04-23 | Disposition: A | Discharge status: Home / Self Care | Payer: No Typology Code available for payment source | Source: Ambulatory Visit | Attending: Internal Medicine | Admitting: Internal Medicine

## 2021-04-23 ENCOUNTER — Inpatient Hospital Stay: Admission: RE | Admit: 2021-04-23 | Payer: Self-pay | Source: Ambulatory Visit

## 2021-04-23 DIAGNOSIS — E7800 Pure hypercholesterolemia, unspecified: Secondary | ICD-10-CM

## 2021-05-11 ENCOUNTER — Other Ambulatory Visit: Payer: Self-pay

## 2021-06-08 DIAGNOSIS — H0102B Squamous blepharitis left eye, upper and lower eyelids: Secondary | ICD-10-CM | POA: Diagnosis not present

## 2021-06-08 DIAGNOSIS — H401131 Primary open-angle glaucoma, bilateral, mild stage: Secondary | ICD-10-CM | POA: Diagnosis not present

## 2021-06-08 DIAGNOSIS — H2513 Age-related nuclear cataract, bilateral: Secondary | ICD-10-CM | POA: Diagnosis not present

## 2021-06-08 DIAGNOSIS — H0102A Squamous blepharitis right eye, upper and lower eyelids: Secondary | ICD-10-CM | POA: Diagnosis not present

## 2021-06-28 DIAGNOSIS — E041 Nontoxic single thyroid nodule: Secondary | ICD-10-CM | POA: Diagnosis not present

## 2021-06-28 DIAGNOSIS — R911 Solitary pulmonary nodule: Secondary | ICD-10-CM | POA: Diagnosis not present

## 2021-06-28 DIAGNOSIS — I2510 Atherosclerotic heart disease of native coronary artery without angina pectoris: Secondary | ICD-10-CM | POA: Diagnosis not present

## 2021-06-28 DIAGNOSIS — I700 Atherosclerosis of aorta: Secondary | ICD-10-CM | POA: Diagnosis not present

## 2021-06-28 DIAGNOSIS — I2584 Coronary atherosclerosis due to calcified coronary lesion: Secondary | ICD-10-CM | POA: Diagnosis not present

## 2021-07-13 DIAGNOSIS — R972 Elevated prostate specific antigen [PSA]: Secondary | ICD-10-CM | POA: Diagnosis not present

## 2021-07-19 DIAGNOSIS — R3915 Urgency of urination: Secondary | ICD-10-CM | POA: Diagnosis not present

## 2021-07-19 DIAGNOSIS — R972 Elevated prostate specific antigen [PSA]: Secondary | ICD-10-CM | POA: Diagnosis not present

## 2021-07-19 DIAGNOSIS — R3914 Feeling of incomplete bladder emptying: Secondary | ICD-10-CM | POA: Diagnosis not present

## 2021-07-19 DIAGNOSIS — N403 Nodular prostate with lower urinary tract symptoms: Secondary | ICD-10-CM | POA: Diagnosis not present

## 2021-09-17 DIAGNOSIS — M7138 Other bursal cyst, other site: Secondary | ICD-10-CM | POA: Diagnosis not present

## 2021-09-17 DIAGNOSIS — G5702 Lesion of sciatic nerve, left lower limb: Secondary | ICD-10-CM | POA: Diagnosis not present

## 2021-09-17 DIAGNOSIS — R292 Abnormal reflex: Secondary | ICD-10-CM | POA: Diagnosis not present

## 2021-09-21 ENCOUNTER — Other Ambulatory Visit: Payer: Self-pay | Admitting: Registered Nurse

## 2021-09-21 DIAGNOSIS — R292 Abnormal reflex: Secondary | ICD-10-CM

## 2021-09-28 DIAGNOSIS — M7138 Other bursal cyst, other site: Secondary | ICD-10-CM | POA: Diagnosis not present

## 2021-09-28 DIAGNOSIS — G5702 Lesion of sciatic nerve, left lower limb: Secondary | ICD-10-CM | POA: Diagnosis not present

## 2021-09-28 DIAGNOSIS — R29890 Loss of height: Secondary | ICD-10-CM | POA: Diagnosis not present

## 2021-09-28 DIAGNOSIS — R292 Abnormal reflex: Secondary | ICD-10-CM | POA: Diagnosis not present

## 2021-10-07 ENCOUNTER — Ambulatory Visit
Admission: RE | Admit: 2021-10-07 | Discharge: 2021-10-07 | Disposition: A | Discharge status: Home / Self Care | Payer: HMO | Source: Ambulatory Visit | Attending: Registered Nurse | Admitting: Registered Nurse

## 2021-10-07 DIAGNOSIS — M4807 Spinal stenosis, lumbosacral region: Secondary | ICD-10-CM | POA: Diagnosis not present

## 2021-10-07 DIAGNOSIS — M48061 Spinal stenosis, lumbar region without neurogenic claudication: Secondary | ICD-10-CM | POA: Diagnosis not present

## 2021-10-07 DIAGNOSIS — M5440 Lumbago with sciatica, unspecified side: Secondary | ICD-10-CM | POA: Diagnosis not present

## 2021-10-07 DIAGNOSIS — M4319 Spondylolisthesis, multiple sites in spine: Secondary | ICD-10-CM | POA: Diagnosis not present

## 2021-10-07 DIAGNOSIS — R292 Abnormal reflex: Secondary | ICD-10-CM

## 2021-10-07 DIAGNOSIS — M5126 Other intervertebral disc displacement, lumbar region: Secondary | ICD-10-CM | POA: Diagnosis not present

## 2021-10-07 MED ORDER — GADOBENATE DIMEGLUMINE 529 MG/ML IV SOLN
15.0000 mL | Freq: Once | INTRAVENOUS | Status: AC | PRN
  Administered 2021-10-07: 15 mL via INTRAVENOUS

## 2021-10-12 ENCOUNTER — Other Ambulatory Visit: Payer: No Typology Code available for payment source

## 2021-10-25 ENCOUNTER — Ambulatory Visit: Payer: HMO | Admitting: Physical Therapy

## 2021-10-29 ENCOUNTER — Ambulatory Visit: Payer: HMO | Admitting: Physical Therapy

## 2021-11-01 ENCOUNTER — Ambulatory Visit: Payer: HMO | Admitting: Physical Therapy

## 2021-11-05 ENCOUNTER — Ambulatory Visit: Payer: HMO | Admitting: Physical Therapy

## 2021-11-08 ENCOUNTER — Ambulatory Visit: Payer: HMO | Admitting: Physical Therapy

## 2021-11-10 DIAGNOSIS — L814 Other melanin hyperpigmentation: Secondary | ICD-10-CM | POA: Diagnosis not present

## 2021-11-10 DIAGNOSIS — D225 Melanocytic nevi of trunk: Secondary | ICD-10-CM | POA: Diagnosis not present

## 2021-11-10 DIAGNOSIS — L570 Actinic keratosis: Secondary | ICD-10-CM | POA: Diagnosis not present

## 2021-11-10 DIAGNOSIS — D2261 Melanocytic nevi of right upper limb, including shoulder: Secondary | ICD-10-CM | POA: Diagnosis not present

## 2021-11-10 DIAGNOSIS — D2272 Melanocytic nevi of left lower limb, including hip: Secondary | ICD-10-CM | POA: Diagnosis not present

## 2021-11-10 DIAGNOSIS — B353 Tinea pedis: Secondary | ICD-10-CM | POA: Diagnosis not present

## 2021-11-10 DIAGNOSIS — L821 Other seborrheic keratosis: Secondary | ICD-10-CM | POA: Diagnosis not present

## 2021-11-12 ENCOUNTER — Ambulatory Visit: Payer: HMO | Admitting: Physical Therapy

## 2021-12-07 DIAGNOSIS — H0102A Squamous blepharitis right eye, upper and lower eyelids: Secondary | ICD-10-CM | POA: Diagnosis not present

## 2021-12-07 DIAGNOSIS — H0102B Squamous blepharitis left eye, upper and lower eyelids: Secondary | ICD-10-CM | POA: Diagnosis not present

## 2021-12-07 DIAGNOSIS — H401131 Primary open-angle glaucoma, bilateral, mild stage: Secondary | ICD-10-CM | POA: Diagnosis not present

## 2021-12-07 DIAGNOSIS — H2513 Age-related nuclear cataract, bilateral: Secondary | ICD-10-CM | POA: Diagnosis not present

## 2021-12-08 ENCOUNTER — Ambulatory Visit: Payer: PPO | Attending: Neurosurgery | Admitting: Physical Therapy

## 2021-12-08 ENCOUNTER — Encounter: Payer: Self-pay | Admitting: Physical Therapy

## 2021-12-08 DIAGNOSIS — M62838 Other muscle spasm: Secondary | ICD-10-CM | POA: Diagnosis not present

## 2021-12-08 DIAGNOSIS — M5459 Other low back pain: Secondary | ICD-10-CM | POA: Diagnosis not present

## 2021-12-08 DIAGNOSIS — M6281 Muscle weakness (generalized): Secondary | ICD-10-CM | POA: Insufficient documentation

## 2021-12-08 NOTE — Therapy (Signed)
OUTPATIENT PHYSICAL THERAPY THORACOLUMBAR EVALUATION   Patient Name: Glen Parker MRN: [id number] DOB:Dec 01, 1950, 71 y.o., male Today's Date: 12/08/2021   PT End of Session - 12/08/21 1104     Visit Number 1    Number of Visits 5    Date for PT Re-Evaluation 01/05/22    Authorization Type HTA    Authorization Time Period 12/08/21 to 01/05/22    Progress Note Due on Visit 10    PT Start Time 1030   arrived late then in bathroom   PT Stop Time 1100    PT Time Calculation (min) 30 min    Activity Tolerance Patient tolerated treatment well    Behavior During Therapy [facility] for tasks assessed/performed             Past Medical History:  Diagnosis Date   Allergy    controls hives with antihistamine   Arthritis    spine   Eczema    occasional   Glaucoma    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   Past Surgical History:  Procedure Laterality Date   COLONOSCOPY     unsure when, where- normal per pt   LUMBAR LAMINECTOMY/DECOMPRESSION MICRODISCECTOMY N/A 12/10/2012   Procedure: LUMBAR LAMINECTOMY/DECOMPRESSION MICRODISCECTOMY 1 LEVEL;  Surgeon: Elaina Hoops, MD;  Location: [place] [facility];  Service: Neurosurgery;  Laterality: N/A;  Lumbar four-five   THYROID CYST EXCISION  11/2012   right   TONSILLECTOMY     as child   There are no problems to display for this patient.   PCP: Jani Gravel   REFERRING PROVIDER: Kary Kos, MD   REFERRING DIAG: M54.40 (ICD-10-CM) - Lumbago with sciatica, unspecified side   Rationale for Evaluation and Treatment Rehabilitation  THERAPY DIAG:  Other low back pain - Plan: PT plan of care cert/re-cert  Muscle weakness (generalized) - Plan: PT plan of care cert/re-cert  Other muscle spasm - Plan: PT plan of care cert/re-cert  ONSET DATE: 47/42/5956   SUBJECTIVE:                                                                                                                                                                                            SUBJECTIVE STATEMENT: I had surgery in 2014. I stopped exercising during Covid and I think that's part of the problem. My back doesn't bother me all the time, but sometimes and my left leg bothers me more. I had  foot drop right before surgery and the surgery got rid of the cyst but didn't get rid of drop foot. I don't wear a brace for the drop foot, my brain connects to my muscle and I guess I only have one muscle that prevents the drop foot it gets tired if I walk too much. I've never had a great sense of balance.  PERTINENT HISTORY:  OA, pinched nerve in neck, lumbar laminectomy   PAIN:  Are you having pain? Yes:    PRECAUTIONS: None  WEIGHT BEARING RESTRICTIONS No  FALLS:  Has patient fallen in last 6 months? Yes. Number of falls 1  LIVING ENVIRONMENT: Lives with: lives with their spouse Lives in: House/apartment Stairs: Yes: Internal: 13 steps; can reach both and External: 4 steps; can reach both Has following equipment at home: None  OCCUPATION: retired  PLOF: Independent, Independent with basic ADLs, Independent with gait, and Independent with transfers  PATIENT GOALS  I don't know    OBJECTIVE:   DIAGNOSTIC FINDINGS:  IMPRESSION: 1. 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 Due to disc bulging and posterior element hypertrophy 2. Mild spinal stenosis and moderate left neural foraminal stenosis at L3-4, mildly progressed. 3. Mildly increased anterolisthesis at L5-S1 with severe left neural foraminal stenosis.     SCREENING FOR RED FLAGS: Bowel or bladder incontinence: No Spinal tumors: No Cauda equina syndrome: No Compression fracture: No Abdominal aneurysm: No  COGNITION:  Overall cognitive status:  unclear very talkative and tangential, may be baseline/WNL      SENSATION: Reports toe numbness L LE chronically    MUSCLE LENGTH: Hamstrings WNL Piriformis WNL   POSTURE: rounded shoulders, forward head, decreased lumbar lordosis, and increased thoracic kyphosis  PALPATION: Severe spasms noted L LE ankle everter groups   LUMBAR ROM:   Active  A/PROM  eval  Flexion WNL   Extension WNL   Right lateral flexion WNL   Left lateral flexion WNL   Right rotation   Left rotation    (Blank rows = not tested)   LOWER EXTREMITY MMT:    MMT Right eval Left eval  Hip flexion 4+/5 4+/5  Hip extension 4+/5 4+/5  Hip abduction 4+/5 4+/5  Hip adduction    Hip internal rotation    Hip external rotation    Knee flexion 5/5 5/5  Knee extension 5/5 5/5  Ankle dorsiflexion 5/5 3/5  Ankle plantarflexion    Ankle inversion    Ankle eversion     (Blank rows = not tested)   GAIT: Distance walked: 221f Assistive device utilized: None Level of assistance: Complete Independence Comments: mild eversion/inversion moment and unsteadiness L ankle with gait, also not quite as much toe clearance with gait LLE    TODAY'S TREATMENT  Nustep 6 minutes L5 BLEs only    SKTC 3x10 seconds B  Lumbar rotation stretch 3x10 seconds B  Ankle dorsiflexion with red TB 1x5   PATIENT EDUCATION:  Education details: exam findings, POC, HEP  Person educated: Patient Education method: ECustomer service managerEducation comprehension: verbalized understanding and returned demonstration   HOME EXERCISE PROGRAM: [id number] ASSESSMENT:  CLINICAL IMPRESSION: Patient is a 71y.o. male who was seen today for physical therapy evaluation and treatment for back pain. Pleasant but tangential, mostly concerned with muscle spasm on lateral side of L LE and foot drop L ankle but these are both chronic. Also has some mild soreness in lumbar spine maybe from lots of activity moving right now. Gave him HEP for  ankle and low back, also discussed massage therapist to help address chronic ankle mm spasms. Will plan on having  him come back in for a check in then potentially DC- he's really in good shape physically with minimal impairment from PT standpoint.    OBJECTIVE IMPAIRMENTS Abnormal gait, difficulty walking, decreased strength, improper body mechanics, postural dysfunction, and pain.   ACTIVITY LIMITATIONS squatting and stairs  PARTICIPATION LIMITATIONS: community activity and yard work  PERSONAL FACTORS Education and Time since onset of injury/illness/exacerbation are also affecting patient's functional outcome.   REHAB POTENTIAL: Excellent  CLINICAL DECISION MAKING: Stable/uncomplicated  EVALUATION COMPLEXITY: Low   GOALS: Goals reviewed with patient? No  SHORT TERM GOALS: Target date: 12/22/2021  Will be complaint with appropriate HEP  Baseline: Goal status: INITIAL   LONG TERM GOALS: Target date: 01/05/2022  MMT in L ankle dorsiflexion to improve to 4-/5  Baseline:  Goal status: INITIAL  2.  Spasm in lateral L shin to have improved by at least 50% to reduce pain  Baseline:  Goal status: INITIAL  3.  Low back pain to be completely resolved  Baseline:  Goal status: INITIAL  4.  Will demonstrate good functional lifting mechanics from floor to waist height  Baseline:  Goal status: INITIAL   PLAN: PT FREQUENCY:  4 visits   PT DURATION: 4 weeks  PLANNED INTERVENTIONS: Therapeutic exercises, Therapeutic activity, Neuromuscular re-education, Balance training, Gait training, Patient/Family education, Joint mobilization, Dry Needling, Electrical stimulation, Cryotherapy, Moist heat, Taping, Ultrasound, Manual therapy, and Re-evaluation.  PLAN FOR NEXT SESSION: how is HEP coming? STM or DN to L lower LE. Does he need to continue or is he OK?   Ann Lions PT, DPT, PN2   Supplemental Physical Therapist [place]

## 2021-12-21 ENCOUNTER — Encounter: Payer: Self-pay | Admitting: Physical Therapy

## 2021-12-21 ENCOUNTER — Ambulatory Visit: Payer: PPO | Attending: Neurosurgery | Admitting: Physical Therapy

## 2021-12-21 DIAGNOSIS — M62838 Other muscle spasm: Secondary | ICD-10-CM | POA: Insufficient documentation

## 2021-12-21 DIAGNOSIS — M6281 Muscle weakness (generalized): Secondary | ICD-10-CM | POA: Insufficient documentation

## 2021-12-21 DIAGNOSIS — M5459 Other low back pain: Secondary | ICD-10-CM | POA: Insufficient documentation

## 2021-12-21 NOTE — Therapy (Signed)
[place]. [place], [place], [address] Phone: [phone]   Fax:  [phone]  Physical Therapy Treatment  Patient Details  Name: Nivan Melendrez MRN: [id number] Date of Birth: Nov 18, 1950 No data recorded  Encounter Date: 12/21/2021  PHYSICAL THERAPY DISCHARGE SUMMARY  Visits from Start of Care: 2  Current functional level related to goals / functional outcomes: All concerns addressed and managed with long term HEP and education. No further need for skilled PT services.    Remaining deficits: Ankle weakness   Education / Equipment: See below    Patient agrees to discharge. Patient goals were partially met. Patient is being discharged due to being pleased with the current functional level.    PT End of Session - 12/21/21 1340     Visit Number 2    Number of Visits 2    Date for PT Re-Evaluation 01/05/22    Authorization Type HTA    Authorization Time Period 12/08/21 to 01/05/22    Progress Note Due on Visit 10    PT Start Time 7341    PT Stop Time 1347   DC today   PT Time Calculation (min) 30 min    Activity Tolerance Patient tolerated treatment well    Behavior During Therapy [facility] for tasks assessed/performed             Past Medical History:  Diagnosis Date   Allergy    controls hives with antihistamine   Arthritis    spine   Eczema    occasional   Glaucoma    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    Past Surgical History:  Procedure Laterality Date   COLONOSCOPY     unsure when, where- normal per pt   LUMBAR LAMINECTOMY/DECOMPRESSION MICRODISCECTOMY N/A 12/10/2012   Procedure: LUMBAR LAMINECTOMY/DECOMPRESSION MICRODISCECTOMY 1 LEVEL;  Surgeon: Elaina Hoops, MD;  Location: [place] [facility];  Service: Neurosurgery;  Laterality: N/A;  Lumbar four-five    THYROID CYST EXCISION  11/2012   right   TONSILLECTOMY     as child    There were no vitals filed for this visit.   Subjective Assessment - 12/21/21 1323     Subjective Things were going pretty good but last night I had some charlie horses in my big toe and maybe left calf too. The only day I felt like I had a little bit of an issue was thursday last week, I didn't do any exercises that day because it just didn't feel right I don't know why. My foot is slapping more today because I'm not paying a lot of attention to it.    Currently in Pain? No/denies                               [facility] Adult PT Treatment/Exercise - 12/21/21 0001       Exercises   Exercises Lumbar      Lumbar Exercises: Standing   Other Standing Lumbar Exercises functional lifting lifting floor to waist height nad back      Lumbar Exercises: Supine   Other Supine Lumbar Exercises PPT 1x15 with 3 seconds; PPT with march 1x10 B; PPT with double bent leg lift 1x10  PT Education - 12/21/21 1339     Education Details lifting mechanics HEP update, DC today, continue to use massager on tight muscle, appropriate progression of reps and resistance for TB for ankle strength, encouraged return to gym and yoga    Person(s) Educated Patient    Methods Explanation    Comprehension Verbalized understanding                        Plan - 12/21/21 1344     Clinical Impression Statement Whit arrives today doing well, exercises have been helping but he did have some random Charlie horses last night. We progressed his home program as appropriate today, encouraged him to continue using home massager on mm spasm as this seems to be helping quite a bit. Also reviewed functional lifting mechanics. Still doing really well, DC today- thank you for the referral!    PT Next Visit Plan DC today    PT Home Exercise Plan [id number]             Patient will benefit  from skilled therapeutic intervention in order to improve the following deficits and impairments:     Visit Diagnosis: Other low back pain  Muscle weakness (generalized)  Other muscle spasm     Problem List There are no problems to display for this patient.  Ann Lions PT, DPT, PN2   Supplemental Physical Therapist [place]. [place], [place], [address] Phone: [phone]   Fax:  [phone]  Name: Lynda Capistran MRN: [id number] Date of Birth: 27-Nov-1950

## 2022-03-16 DIAGNOSIS — B353 Tinea pedis: Secondary | ICD-10-CM | POA: Diagnosis not present

## 2022-04-07 DIAGNOSIS — E7800 Pure hypercholesterolemia, unspecified: Secondary | ICD-10-CM | POA: Diagnosis not present

## 2022-04-07 DIAGNOSIS — Z125 Encounter for screening for malignant neoplasm of prostate: Secondary | ICD-10-CM | POA: Diagnosis not present

## 2022-04-11 DIAGNOSIS — Z0000 Encounter for general adult medical examination without abnormal findings: Secondary | ICD-10-CM | POA: Diagnosis not present

## 2022-04-11 DIAGNOSIS — D72819 Decreased white blood cell count, unspecified: Secondary | ICD-10-CM | POA: Diagnosis not present

## 2022-04-11 DIAGNOSIS — F411 Generalized anxiety disorder: Secondary | ICD-10-CM | POA: Diagnosis not present

## 2022-04-11 DIAGNOSIS — I2510 Atherosclerotic heart disease of native coronary artery without angina pectoris: Secondary | ICD-10-CM | POA: Diagnosis not present

## 2022-04-11 DIAGNOSIS — E041 Nontoxic single thyroid nodule: Secondary | ICD-10-CM | POA: Diagnosis not present

## 2022-04-11 DIAGNOSIS — I700 Atherosclerosis of aorta: Secondary | ICD-10-CM | POA: Diagnosis not present

## 2022-04-11 DIAGNOSIS — R972 Elevated prostate specific antigen [PSA]: Secondary | ICD-10-CM | POA: Diagnosis not present

## 2022-04-11 DIAGNOSIS — Z23 Encounter for immunization: Secondary | ICD-10-CM | POA: Diagnosis not present

## 2022-04-22 ENCOUNTER — Other Ambulatory Visit: Payer: Self-pay | Admitting: Urology

## 2022-05-04 ENCOUNTER — Other Ambulatory Visit: Payer: Self-pay | Admitting: Internal Medicine

## 2022-05-04 DIAGNOSIS — I2510 Atherosclerotic heart disease of native coronary artery without angina pectoris: Secondary | ICD-10-CM

## 2022-05-09 DIAGNOSIS — R3912 Poor urinary stream: Secondary | ICD-10-CM | POA: Diagnosis not present

## 2022-05-09 DIAGNOSIS — R3914 Feeling of incomplete bladder emptying: Secondary | ICD-10-CM | POA: Diagnosis not present

## 2022-05-09 DIAGNOSIS — R972 Elevated prostate specific antigen [PSA]: Secondary | ICD-10-CM | POA: Diagnosis not present

## 2022-05-09 DIAGNOSIS — N401 Enlarged prostate with lower urinary tract symptoms: Secondary | ICD-10-CM | POA: Diagnosis not present

## 2022-06-01 DIAGNOSIS — F411 Generalized anxiety disorder: Secondary | ICD-10-CM | POA: Diagnosis not present

## 2022-06-15 ENCOUNTER — Ambulatory Visit
Admission: RE | Admit: 2022-06-15 | Discharge: 2022-06-15 | Disposition: A | Discharge status: Home / Self Care | Payer: PPO | Source: Ambulatory Visit | Attending: Internal Medicine | Admitting: Internal Medicine

## 2022-06-15 DIAGNOSIS — I700 Atherosclerosis of aorta: Secondary | ICD-10-CM | POA: Diagnosis not present

## 2022-06-15 DIAGNOSIS — I2510 Atherosclerotic heart disease of native coronary artery without angina pectoris: Secondary | ICD-10-CM

## 2022-06-15 DIAGNOSIS — R911 Solitary pulmonary nodule: Secondary | ICD-10-CM | POA: Diagnosis not present

## 2022-06-20 DIAGNOSIS — H2513 Age-related nuclear cataract, bilateral: Secondary | ICD-10-CM | POA: Diagnosis not present

## 2022-06-20 DIAGNOSIS — H401131 Primary open-angle glaucoma, bilateral, mild stage: Secondary | ICD-10-CM | POA: Diagnosis not present

## 2022-06-20 DIAGNOSIS — H0102B Squamous blepharitis left eye, upper and lower eyelids: Secondary | ICD-10-CM | POA: Diagnosis not present

## 2022-06-20 DIAGNOSIS — H0102A Squamous blepharitis right eye, upper and lower eyelids: Secondary | ICD-10-CM | POA: Diagnosis not present

## 2022-08-09 DIAGNOSIS — R972 Elevated prostate specific antigen [PSA]: Secondary | ICD-10-CM | POA: Diagnosis not present

## 2022-12-19 DIAGNOSIS — H0102B Squamous blepharitis left eye, upper and lower eyelids: Secondary | ICD-10-CM | POA: Diagnosis not present

## 2022-12-19 DIAGNOSIS — H401131 Primary open-angle glaucoma, bilateral, mild stage: Secondary | ICD-10-CM | POA: Diagnosis not present

## 2022-12-19 DIAGNOSIS — H2513 Age-related nuclear cataract, bilateral: Secondary | ICD-10-CM | POA: Diagnosis not present

## 2022-12-19 DIAGNOSIS — H0102A Squamous blepharitis right eye, upper and lower eyelids: Secondary | ICD-10-CM | POA: Diagnosis not present

## 2023-01-11 DIAGNOSIS — D235 Other benign neoplasm of skin of trunk: Secondary | ICD-10-CM | POA: Diagnosis not present

## 2023-01-11 DIAGNOSIS — D2271 Melanocytic nevi of right lower limb, including hip: Secondary | ICD-10-CM | POA: Diagnosis not present

## 2023-01-11 DIAGNOSIS — L821 Other seborrheic keratosis: Secondary | ICD-10-CM | POA: Diagnosis not present

## 2023-01-11 DIAGNOSIS — L905 Scar conditions and fibrosis of skin: Secondary | ICD-10-CM | POA: Diagnosis not present

## 2023-01-11 DIAGNOSIS — D2272 Melanocytic nevi of left lower limb, including hip: Secondary | ICD-10-CM | POA: Diagnosis not present

## 2023-01-11 DIAGNOSIS — D2261 Melanocytic nevi of right upper limb, including shoulder: Secondary | ICD-10-CM | POA: Diagnosis not present

## 2023-01-11 DIAGNOSIS — L918 Other hypertrophic disorders of the skin: Secondary | ICD-10-CM | POA: Diagnosis not present

## 2023-01-11 DIAGNOSIS — D225 Melanocytic nevi of trunk: Secondary | ICD-10-CM | POA: Diagnosis not present

## 2023-01-11 DIAGNOSIS — D1801 Hemangioma of skin and subcutaneous tissue: Secondary | ICD-10-CM | POA: Diagnosis not present

## 2023-04-11 DIAGNOSIS — E079 Disorder of thyroid, unspecified: Secondary | ICD-10-CM | POA: Diagnosis not present

## 2023-04-11 DIAGNOSIS — Z125 Encounter for screening for malignant neoplasm of prostate: Secondary | ICD-10-CM | POA: Diagnosis not present

## 2023-04-11 DIAGNOSIS — E7800 Pure hypercholesterolemia, unspecified: Secondary | ICD-10-CM | POA: Diagnosis not present

## 2023-04-14 DIAGNOSIS — H6993 Unspecified Eustachian tube disorder, bilateral: Secondary | ICD-10-CM | POA: Diagnosis not present

## 2023-04-14 DIAGNOSIS — Z0000 Encounter for general adult medical examination without abnormal findings: Secondary | ICD-10-CM | POA: Diagnosis not present

## 2023-04-14 DIAGNOSIS — I2584 Coronary atherosclerosis due to calcified coronary lesion: Secondary | ICD-10-CM | POA: Diagnosis not present

## 2023-04-14 DIAGNOSIS — R972 Elevated prostate specific antigen [PSA]: Secondary | ICD-10-CM | POA: Diagnosis not present

## 2023-04-14 DIAGNOSIS — I700 Atherosclerosis of aorta: Secondary | ICD-10-CM | POA: Diagnosis not present

## 2023-04-14 DIAGNOSIS — I8393 Asymptomatic varicose veins of bilateral lower extremities: Secondary | ICD-10-CM | POA: Diagnosis not present

## 2023-05-03 DIAGNOSIS — R972 Elevated prostate specific antigen [PSA]: Secondary | ICD-10-CM | POA: Diagnosis not present

## 2023-05-10 DIAGNOSIS — R3914 Feeling of incomplete bladder emptying: Secondary | ICD-10-CM | POA: Diagnosis not present

## 2023-05-10 DIAGNOSIS — N401 Enlarged prostate with lower urinary tract symptoms: Secondary | ICD-10-CM | POA: Diagnosis not present

## 2023-05-10 DIAGNOSIS — R972 Elevated prostate specific antigen [PSA]: Secondary | ICD-10-CM | POA: Diagnosis not present

## 2023-05-10 DIAGNOSIS — R3915 Urgency of urination: Secondary | ICD-10-CM | POA: Diagnosis not present

## 2023-06-26 DIAGNOSIS — H2513 Age-related nuclear cataract, bilateral: Secondary | ICD-10-CM | POA: Diagnosis not present

## 2023-06-26 DIAGNOSIS — H0102A Squamous blepharitis right eye, upper and lower eyelids: Secondary | ICD-10-CM | POA: Diagnosis not present

## 2023-06-26 DIAGNOSIS — H0102B Squamous blepharitis left eye, upper and lower eyelids: Secondary | ICD-10-CM | POA: Diagnosis not present

## 2023-06-26 DIAGNOSIS — H401131 Primary open-angle glaucoma, bilateral, mild stage: Secondary | ICD-10-CM | POA: Diagnosis not present

## 2023-09-08 IMAGING — US US THYROID
1 series · 14 of 25 positions shown · non-contrast
Comparison: 10/19/2012

CLINICAL DATA: Thyroid cyst. Previous FNA biopsy of complex right
nodule 10/30/2012.

EXAM:
THYROID ULTRASOUND
TECHNIQUE: Ultrasound examination of the thyroid gland and adjacent soft
tissues was performed.

[Series 1: us thyroid · 0.06mm/px · 14 of 59 slices shown]
[im 1/59]
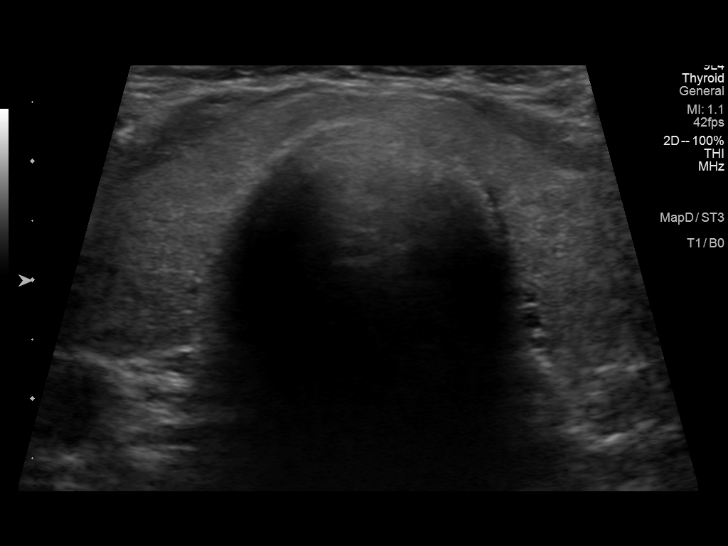
[im 5/59]
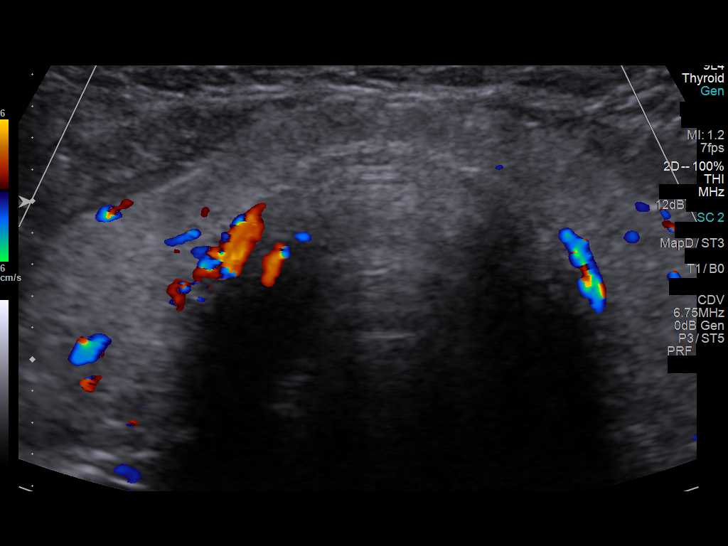
[im 10/59]
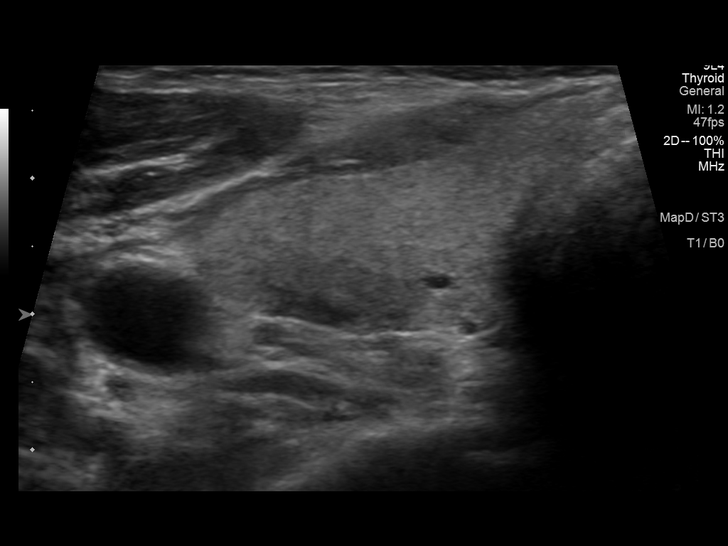
[im 15/59]
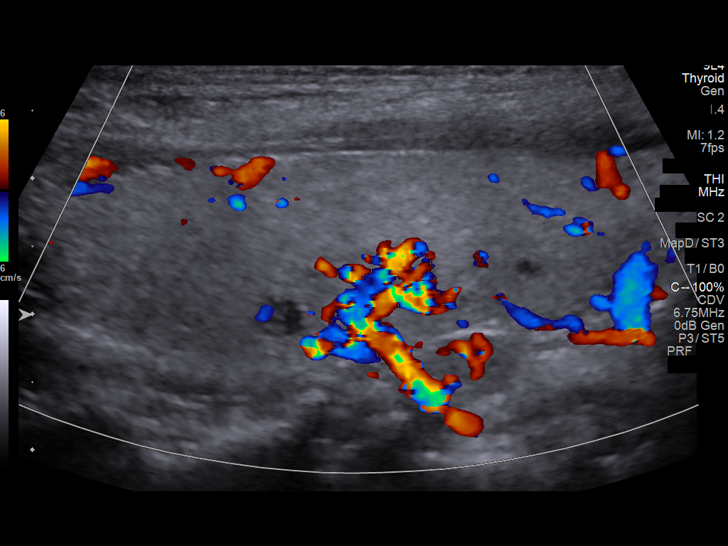
[im 20/59]
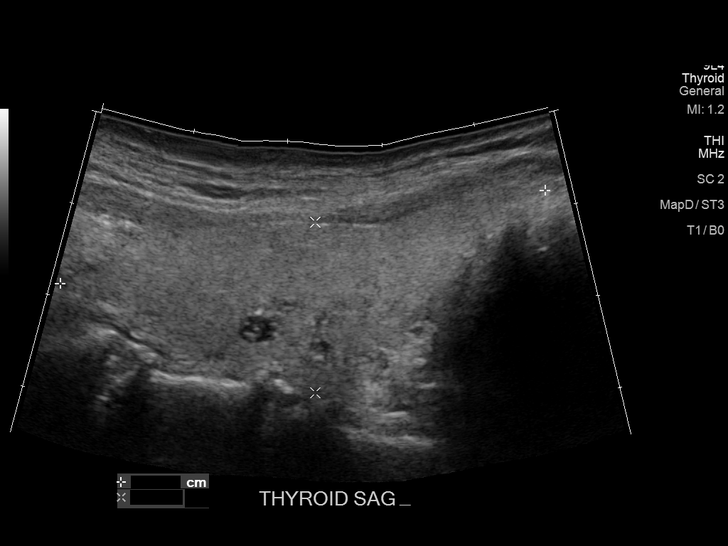
[im 22/59]
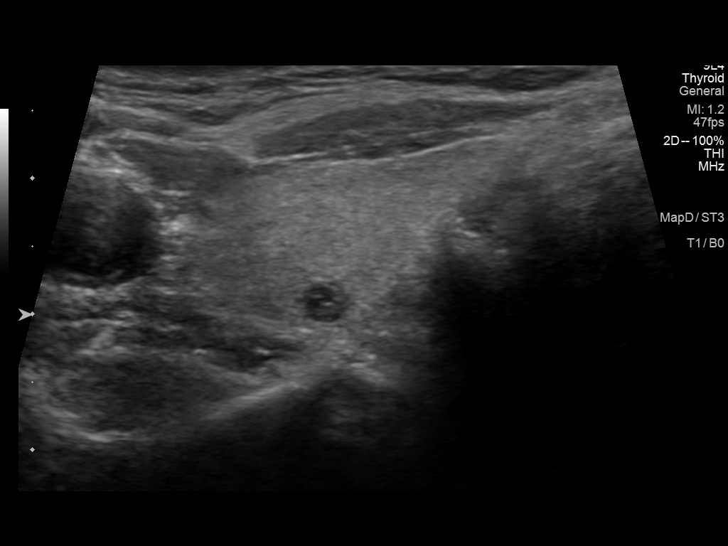
[im 27/59]
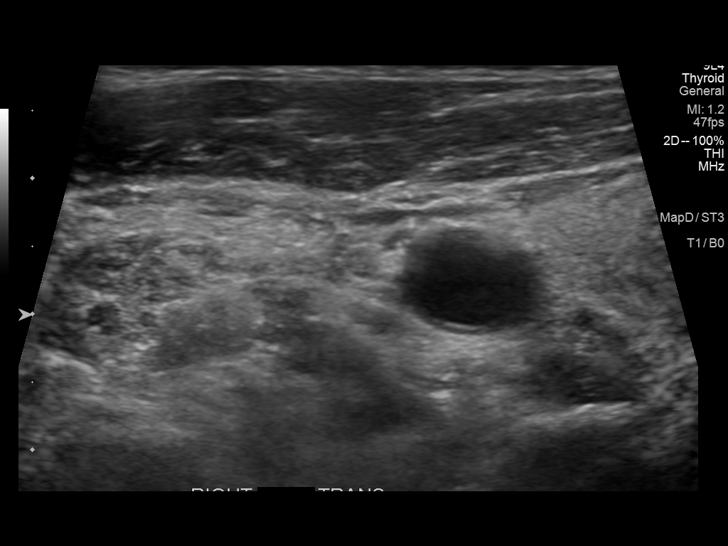
[im 32/59]
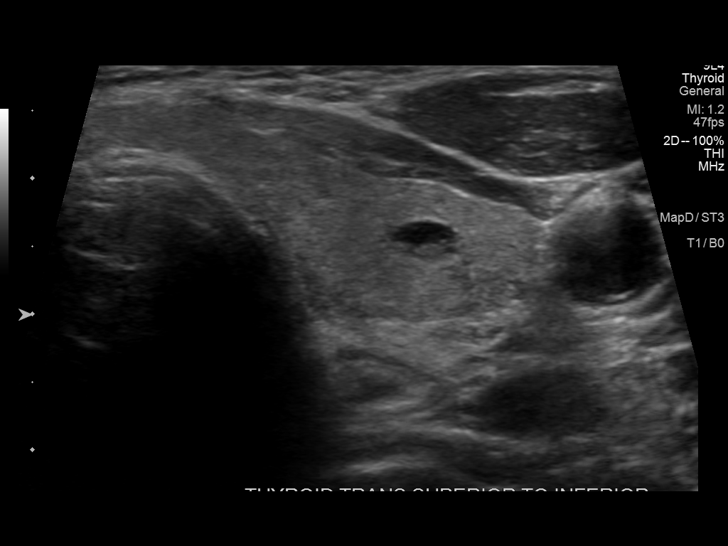
[im 37/59]
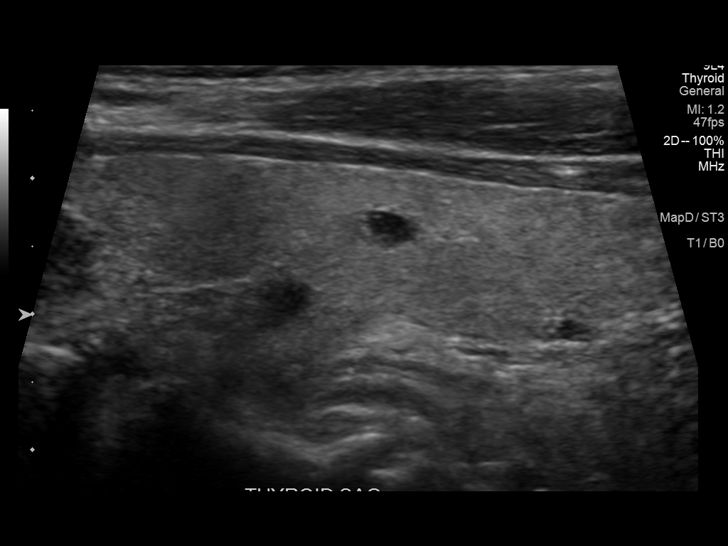
[im 39/59]
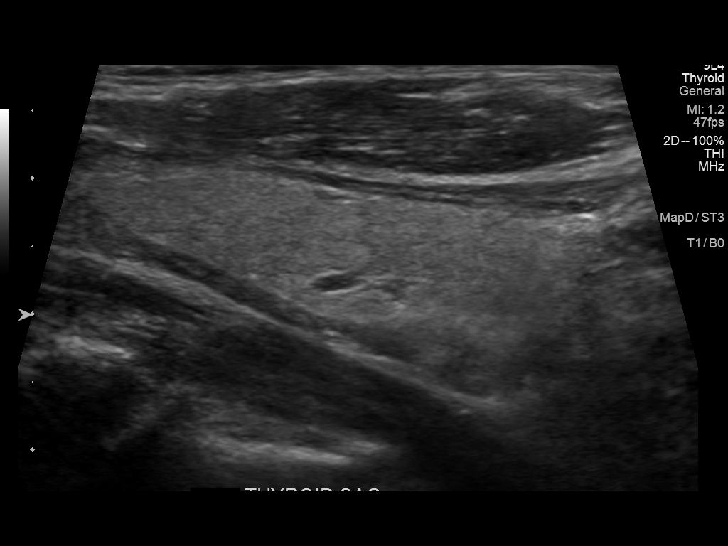
[im 44/59]
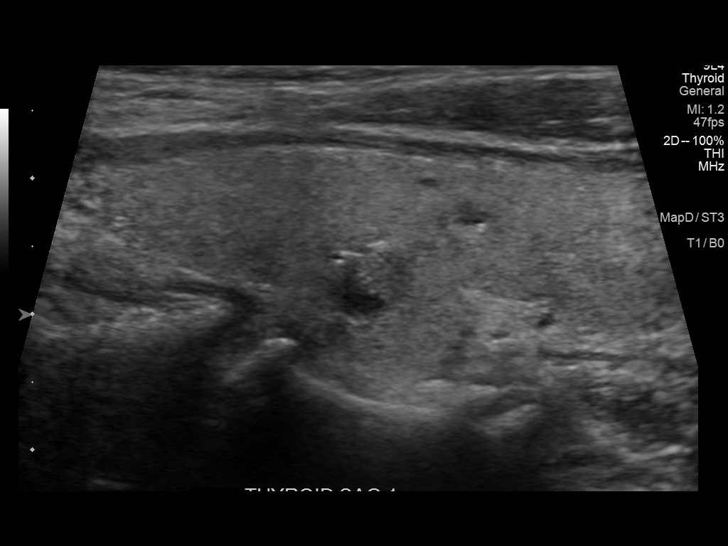
[im 49/59]
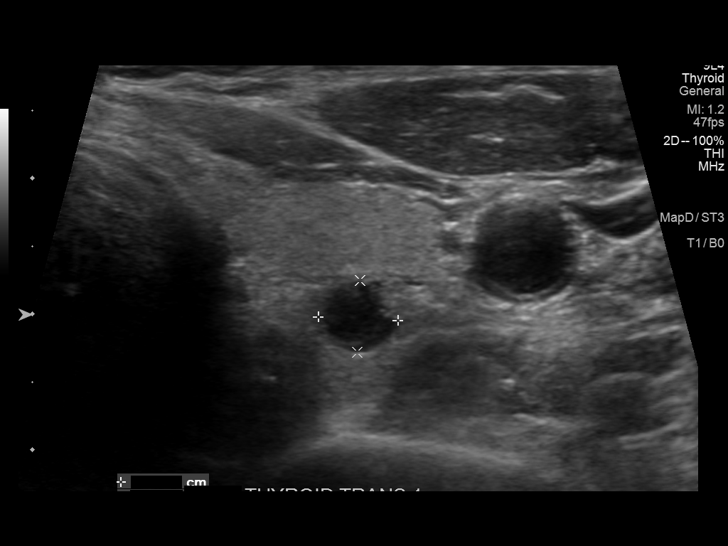
[im 54/59]
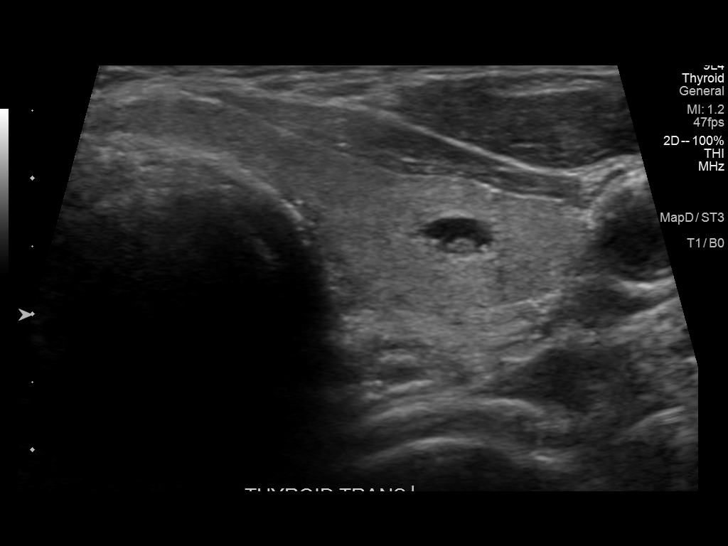
[im 59/59]
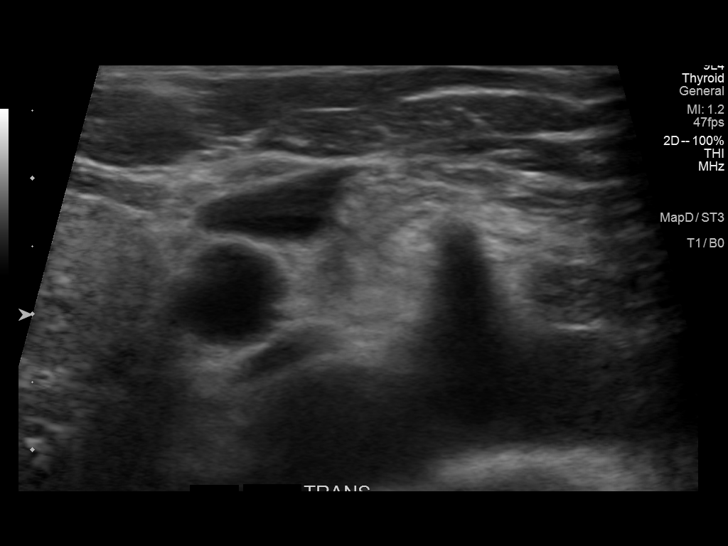

[14 of 25 positions shown; findings below may reference images not displayed]

FINDINGS: Parenchymal Echotexture: Mildly heterogenous

Isthmus: 0.3 cm thickness, previously

Right lobe: 5.2 x 1.8 x 2.2 cm, previously 6.2 x 3.1 x

Left lobe: 5.4 x 1.7 x 1.6 cm, previously 6.2 x 1.5 x

_________________________________________________________

Estimated total number of nodules >/= 1 cm: 0

Number of spongiform nodules >/=  2 cm not described below (TR1): 0

Number of mixed cystic and solid nodules >/= 1.5 cm not described
below (TR2): 0

_________________________________________________________

Scattered small benign colloid cysts, largest 0.6 cm in the mid left
lobe. No worrisome nodule or other indication for biopsy or
follow-up. No regional cervical adenopathy.
IMPRESSION: Mild thyromegaly.  No nodule or indication for biopsy or follow-up.

The above is in keeping with the ACR TI-RADS recommendations - [HOSPITAL] 1872;[DATE].

## 2023-12-06 DIAGNOSIS — H9313 Tinnitus, bilateral: Secondary | ICD-10-CM | POA: Diagnosis not present

## 2023-12-06 DIAGNOSIS — H903 Sensorineural hearing loss, bilateral: Secondary | ICD-10-CM | POA: Diagnosis not present

## 2024-02-19 DIAGNOSIS — D2262 Melanocytic nevi of left upper limb, including shoulder: Secondary | ICD-10-CM | POA: Diagnosis not present

## 2024-02-19 DIAGNOSIS — L738 Other specified follicular disorders: Secondary | ICD-10-CM | POA: Diagnosis not present

## 2024-02-19 DIAGNOSIS — D2272 Melanocytic nevi of left lower limb, including hip: Secondary | ICD-10-CM | POA: Diagnosis not present

## 2024-02-19 DIAGNOSIS — D1801 Hemangioma of skin and subcutaneous tissue: Secondary | ICD-10-CM | POA: Diagnosis not present

## 2024-02-19 DIAGNOSIS — L570 Actinic keratosis: Secondary | ICD-10-CM | POA: Diagnosis not present

## 2024-02-19 DIAGNOSIS — L821 Other seborrheic keratosis: Secondary | ICD-10-CM | POA: Diagnosis not present

## 2024-02-19 DIAGNOSIS — D2271 Melanocytic nevi of right lower limb, including hip: Secondary | ICD-10-CM | POA: Diagnosis not present

## 2024-02-19 DIAGNOSIS — D225 Melanocytic nevi of trunk: Secondary | ICD-10-CM | POA: Diagnosis not present

## 2024-02-19 DIAGNOSIS — D2261 Melanocytic nevi of right upper limb, including shoulder: Secondary | ICD-10-CM | POA: Diagnosis not present

## 2024-02-19 DIAGNOSIS — L814 Other melanin hyperpigmentation: Secondary | ICD-10-CM | POA: Diagnosis not present

## 2024-02-29 IMAGING — MR MR LUMBAR SPINE WO/W CM
4 of 8 series · 22 of 48 positions shown · IV contrast (multihance)
Comparison: Lumbar spine MRI 12/02/2012

CLINICAL DATA: Left buttock and left leg pain for 1 month. Prior
lumbar surgery.

EXAM:
MRI LUMBAR SPINE WITHOUT AND WITH CONTRAST
TECHNIQUE: Multiplanar and multiecho pulse sequences of the lumbar spine were
obtained without and with intravenous contrast.
CONTRAST:  15mL MULTIHANCE GADOBENATE DIMEGLUMINE 529 MG/ML IV SOLN

[Series 6: T1 · sagittal · 4.0mm · 0.73mm/px · 4 of 17 slices shown]
[im 1/17]
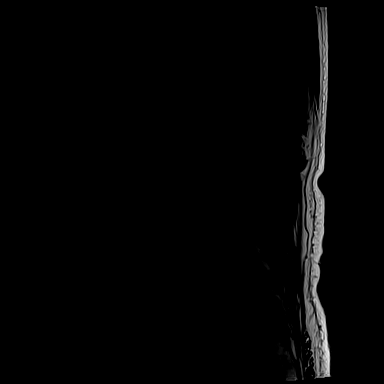
[im 6/17]
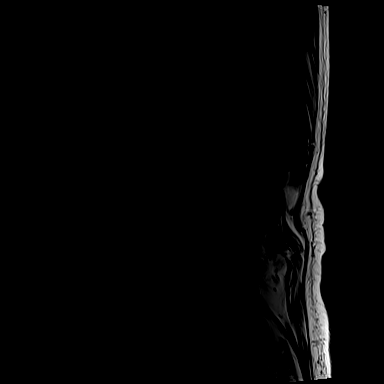
[im 11/17]
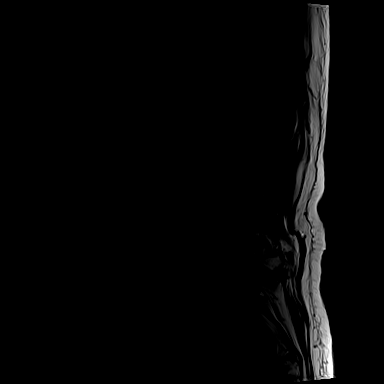
[im 17/17]
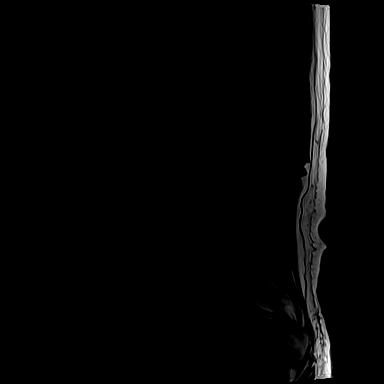

[Series 14: T2 · axial · 4.0mm · 0.28mm/px · z∈[-35,+168]mm · 9 of 37 slices shown (1 of 3)]
[im 1/37]
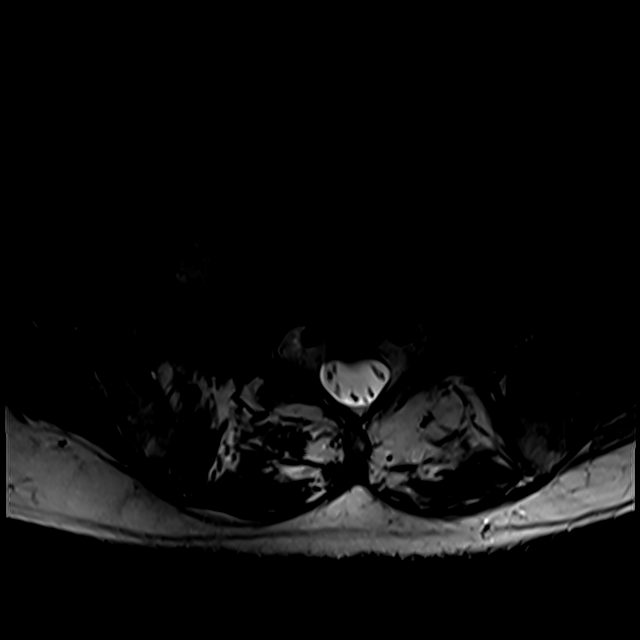
[im 5/37]
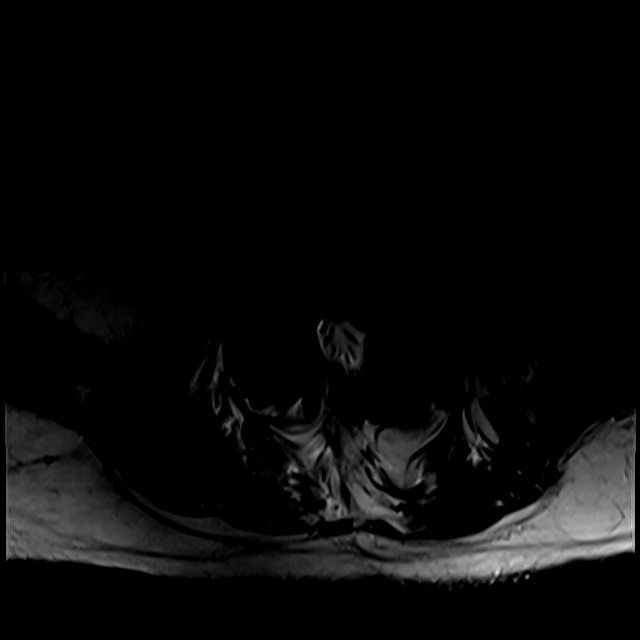
[im 10/37]
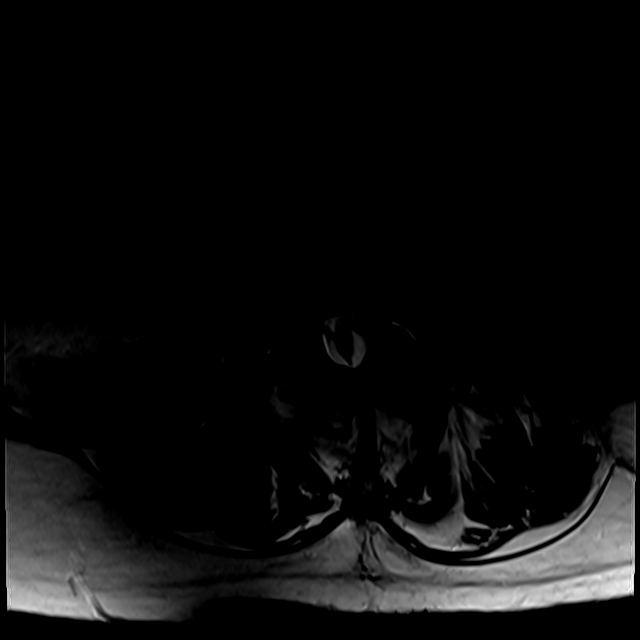
[im 14/37]
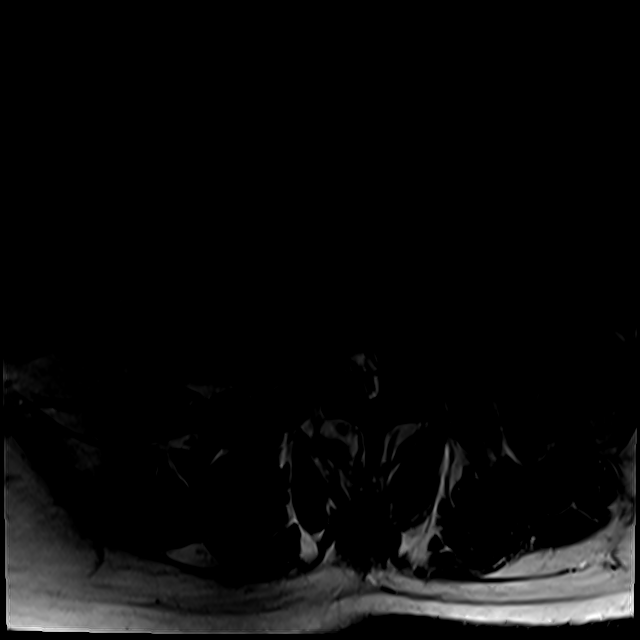
[im 19/37]
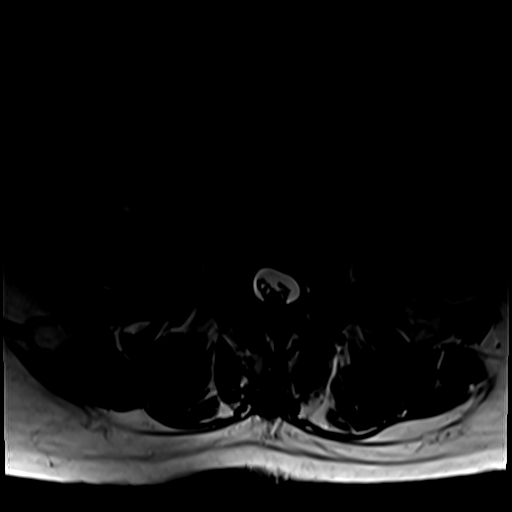
[im 23/37]
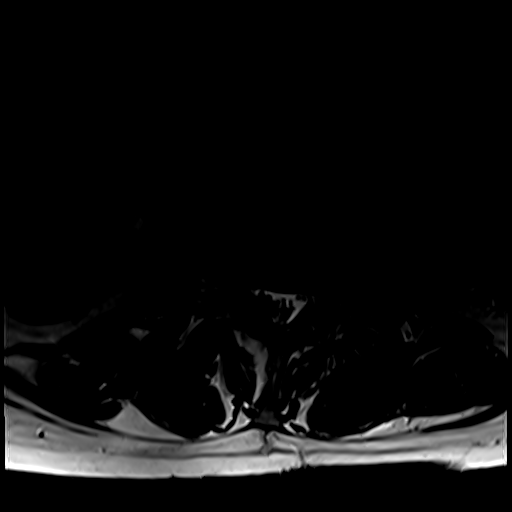
[im 28/37]
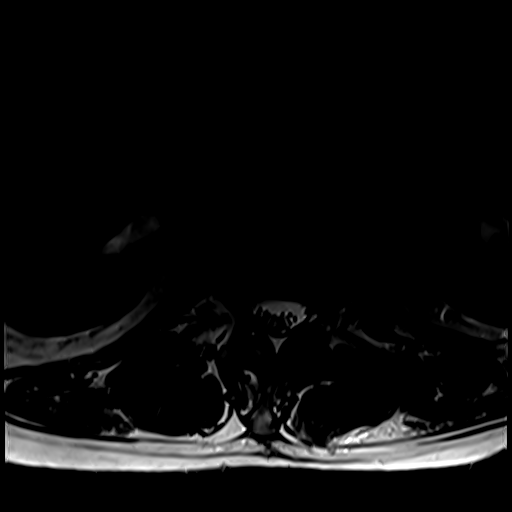
[im 32/37]
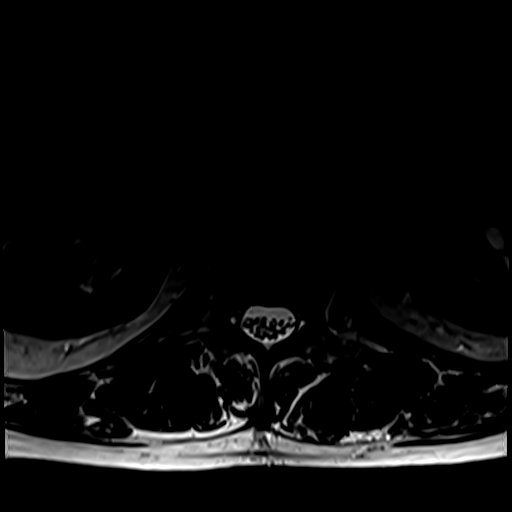
[im 37/37]
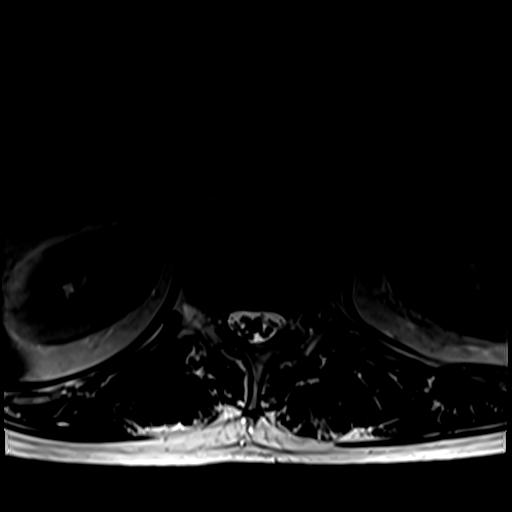

[Series 15: T2 · coronal · 5.0mm · 0.73mm/px · 5 of 19 slices shown (2 of 3)]
[im 1/19]
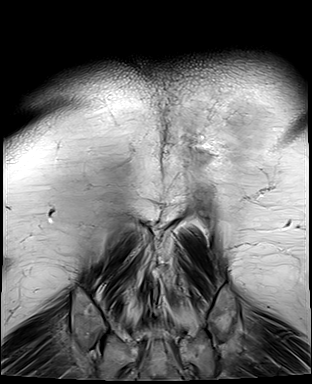
[im 5/19]
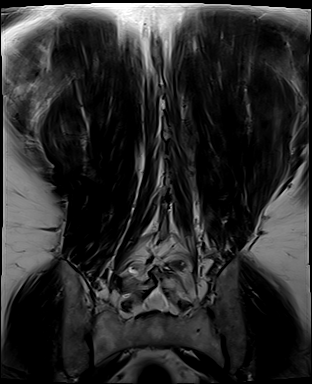
[im 10/19]
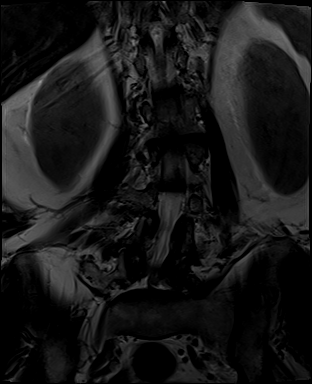
[im 14/19]
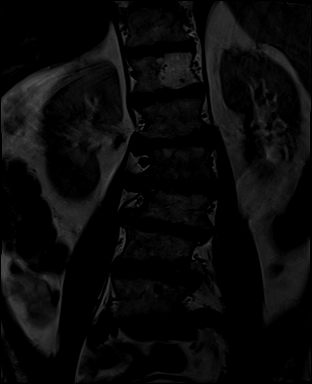
[im 19/19]
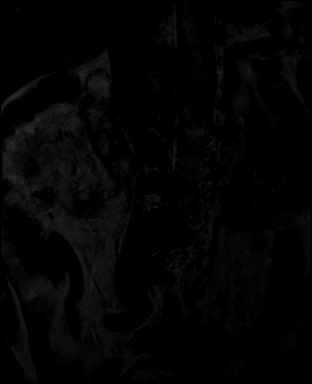

[Series 16: T2 · sagittal · 4.0mm · 0.73mm/px · 4 of 17 slices shown (3 of 3)]
[im 1/17]
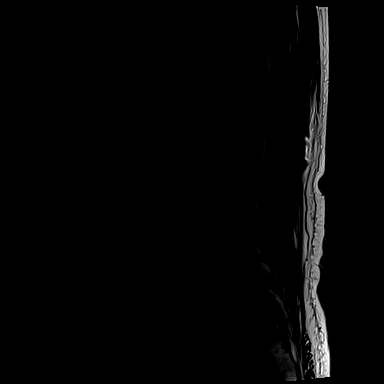
[im 6/17]
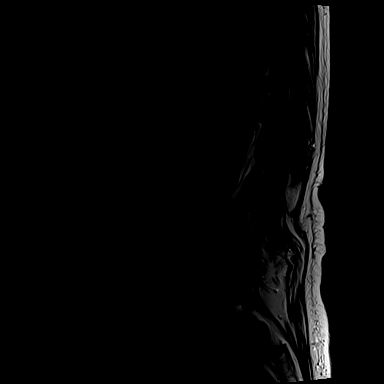
[im 11/17]
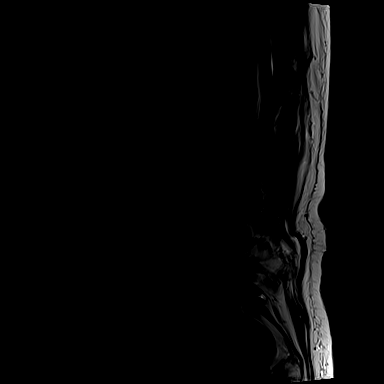
[im 17/17]
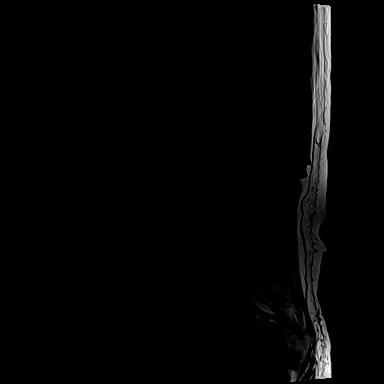

[22 of 48 positions shown; findings below may reference images not displayed]

FINDINGS: Segmentation:  Standard.

Alignment: Mild lumbar levoscoliosis. Unchanged trace retrolisthesis
of L1 on L2, L2 on L3, and L3 on L4. Bilateral L5 pars defects with
8 mm anterolisthesis of L5 on S1, mildly increased.

Vertebrae: No acute fracture or suspicious marrow lesion. Large
hemangioma in the T12 vertebral body. New degenerative endplate
edema at L3-4 and L4-5 and prominent new left facet edema at L4-5.

Conus medullaris and cauda equina: Conus extends to the T12-L1
level. Conus and cauda equina appear normal.

Paraspinal and other soft tissues: Postoperative changes in the
posterior lower lumbar soft tissues. No fluid collection.

Disc levels:

Disc desiccation throughout the lumbar spine with mild disc space
narrowing at L4-5 and moderate to severe narrowing at the other
levels.

T12-L1: Minimal disc bulging and slight facet and ligamentum flavum
hypertrophy without stenosis, unchanged.

L1-2: Right eccentric disc bulging and mild facet and ligamentum
flavum hypertrophy result in mild right lateral recess stenosis
without spinal or neural foraminal stenosis, unchanged.

L2-3: Right eccentric disc bulging and mild facet and ligamentum
flavum hypertrophy result in mild right greater than left lateral
recess stenosis without spinal or neural foraminal stenosis,
unchanged.

L3-4: Circumferential disc bulging and moderate facet hypertrophy
result in mild spinal stenosis, mild-to-moderate bilateral lateral
recess stenosis, and mild right and moderate left neural foraminal
stenosis. Findings have mildly progressed from the prior MRI, most
notably the left neural foraminal stenosis which could affect the
left L3 nerve root.

L4-5: Interval posterior decompression. Circumferential disc bulging
and severe facet and ligamentum flavum hypertrophy result in severe
spinal stenosis at and below the disc space level as well as severe
left greater than right neural foraminal stenosis. Potential
bilateral L4 and L5 nerve root impingement.

L5-S1: Prior posterior decompression. Anterolisthesis with bulging
uncovered disc, endplate spurring, degenerative changes at the L5
pars defects, and facet hypertrophy result in mild right and severe
left neural foraminal stenosis with potential left L5 nerve root
impingement, stable to mildly progressed. No spinal stenosis.
IMPRESSION: 1. Recurrent severe spinal stenosis at L4-5. Due to disc bulging and
posterior element hypertrophy
2. Mild spinal stenosis and moderate left neural foraminal stenosis
at L3-4, mildly progressed.
3. Mildly increased anterolisthesis at L5-S1 with severe left neural
foraminal stenosis.

## 2024-04-10 DIAGNOSIS — Z23 Encounter for immunization: Secondary | ICD-10-CM | POA: Diagnosis not present

## 2024-04-10 DIAGNOSIS — R42 Dizziness and giddiness: Secondary | ICD-10-CM | POA: Diagnosis not present

## 2024-04-10 DIAGNOSIS — R931 Abnormal findings on diagnostic imaging of heart and coronary circulation: Secondary | ICD-10-CM | POA: Diagnosis not present

## 2024-04-15 DIAGNOSIS — Z125 Encounter for screening for malignant neoplasm of prostate: Secondary | ICD-10-CM | POA: Diagnosis not present

## 2024-04-15 DIAGNOSIS — E7800 Pure hypercholesterolemia, unspecified: Secondary | ICD-10-CM | POA: Diagnosis not present

## 2024-05-01 DIAGNOSIS — R972 Elevated prostate specific antigen [PSA]: Secondary | ICD-10-CM | POA: Diagnosis not present

## 2024-05-08 DIAGNOSIS — N401 Enlarged prostate with lower urinary tract symptoms: Secondary | ICD-10-CM | POA: Diagnosis not present

## 2024-05-08 DIAGNOSIS — N5201 Erectile dysfunction due to arterial insufficiency: Secondary | ICD-10-CM | POA: Diagnosis not present

## 2024-05-08 DIAGNOSIS — R3915 Urgency of urination: Secondary | ICD-10-CM | POA: Diagnosis not present

## 2024-05-08 DIAGNOSIS — R399 Unspecified symptoms and signs involving the genitourinary system: Secondary | ICD-10-CM | POA: Diagnosis not present

## 2024-05-08 DIAGNOSIS — R3914 Feeling of incomplete bladder emptying: Secondary | ICD-10-CM | POA: Diagnosis not present

## 2024-05-08 DIAGNOSIS — R972 Elevated prostate specific antigen [PSA]: Secondary | ICD-10-CM | POA: Diagnosis not present

## 2024-05-09 DIAGNOSIS — I700 Atherosclerosis of aorta: Secondary | ICD-10-CM | POA: Diagnosis not present

## 2024-05-09 DIAGNOSIS — N529 Male erectile dysfunction, unspecified: Secondary | ICD-10-CM | POA: Diagnosis not present

## 2024-05-09 DIAGNOSIS — Z0000 Encounter for general adult medical examination without abnormal findings: Secondary | ICD-10-CM | POA: Diagnosis not present

## 2024-05-09 DIAGNOSIS — R351 Nocturia: Secondary | ICD-10-CM | POA: Diagnosis not present

## 2024-05-09 DIAGNOSIS — R972 Elevated prostate specific antigen [PSA]: Secondary | ICD-10-CM | POA: Diagnosis not present

## 2024-05-09 DIAGNOSIS — F411 Generalized anxiety disorder: Secondary | ICD-10-CM | POA: Diagnosis not present

## 2024-05-09 DIAGNOSIS — N401 Enlarged prostate with lower urinary tract symptoms: Secondary | ICD-10-CM | POA: Diagnosis not present

## 2024-05-09 DIAGNOSIS — I2510 Atherosclerotic heart disease of native coronary artery without angina pectoris: Secondary | ICD-10-CM | POA: Diagnosis not present

## 2024-05-09 DIAGNOSIS — R7309 Other abnormal glucose: Secondary | ICD-10-CM | POA: Diagnosis not present

## 2024-05-13 DIAGNOSIS — R7309 Other abnormal glucose: Secondary | ICD-10-CM | POA: Diagnosis not present

## 2024-06-12 DIAGNOSIS — H401131 Primary open-angle glaucoma, bilateral, mild stage: Secondary | ICD-10-CM | POA: Diagnosis not present

## 2024-06-12 DIAGNOSIS — H2513 Age-related nuclear cataract, bilateral: Secondary | ICD-10-CM | POA: Diagnosis not present

## 2024-06-12 DIAGNOSIS — H0102B Squamous blepharitis left eye, upper and lower eyelids: Secondary | ICD-10-CM | POA: Diagnosis not present

## 2024-06-12 DIAGNOSIS — H0102A Squamous blepharitis right eye, upper and lower eyelids: Secondary | ICD-10-CM | POA: Diagnosis not present
